# Patient Record
Sex: Male | Born: 1945 | Race: White | Hispanic: No | Marital: Married | Smoking: Former smoker
Health system: Southern US, Community
[De-identification: ages and names within clinical notes are randomized; demographics above are authoritative.]

## PROBLEM LIST (undated history)

## (undated) DIAGNOSIS — I219 Acute myocardial infarction, unspecified: Secondary | ICD-10-CM

## (undated) DIAGNOSIS — C3490 Malignant neoplasm of unspecified part of unspecified bronchus or lung: Secondary | ICD-10-CM

## (undated) DIAGNOSIS — I10 Essential (primary) hypertension: Secondary | ICD-10-CM

## (undated) DIAGNOSIS — R739 Hyperglycemia, unspecified: Secondary | ICD-10-CM

## (undated) DIAGNOSIS — I2510 Atherosclerotic heart disease of native coronary artery without angina pectoris: Secondary | ICD-10-CM

## (undated) DIAGNOSIS — E785 Hyperlipidemia, unspecified: Secondary | ICD-10-CM

## (undated) HISTORY — DX: Acute myocardial infarction, unspecified: I21.9

## (undated) HISTORY — PX: LOBECTOMY: SHX5089

## (undated) HISTORY — DX: Hyperglycemia, unspecified: R73.9

## (undated) HISTORY — DX: Hyperlipidemia, unspecified: E78.5

---

## 2000-05-27 ENCOUNTER — Ambulatory Visit (HOSPITAL_COMMUNITY): Admission: RE | Admit: 2000-05-27 | Discharge: 2000-05-27 | Payer: Self-pay | Admitting: Family Medicine

## 2000-05-27 ENCOUNTER — Encounter: Payer: Self-pay | Admitting: Family Medicine

## 2000-06-02 ENCOUNTER — Encounter: Payer: Self-pay | Admitting: Internal Medicine

## 2000-06-02 ENCOUNTER — Ambulatory Visit (HOSPITAL_COMMUNITY): Admission: RE | Admit: 2000-06-02 | Discharge: 2000-06-02 | Payer: Self-pay | Admitting: Internal Medicine

## 2000-06-11 ENCOUNTER — Encounter: Payer: Self-pay | Admitting: Thoracic Surgery

## 2000-06-12 ENCOUNTER — Inpatient Hospital Stay (HOSPITAL_COMMUNITY): Admission: RE | Admit: 2000-06-12 | Discharge: 2000-06-26 | Payer: Self-pay | Admitting: Thoracic Surgery

## 2000-06-13 ENCOUNTER — Encounter: Payer: Self-pay | Admitting: Thoracic Surgery

## 2000-06-14 ENCOUNTER — Encounter: Payer: Self-pay | Admitting: Thoracic Surgery

## 2000-06-15 ENCOUNTER — Encounter: Payer: Self-pay | Admitting: Thoracic Surgery

## 2000-06-16 ENCOUNTER — Encounter: Payer: Self-pay | Admitting: Thoracic Surgery

## 2000-06-17 ENCOUNTER — Encounter: Payer: Self-pay | Admitting: Thoracic Surgery

## 2000-06-18 ENCOUNTER — Encounter: Payer: Self-pay | Admitting: Thoracic Surgery

## 2000-06-19 ENCOUNTER — Encounter: Payer: Self-pay | Admitting: Thoracic Surgery

## 2000-06-20 ENCOUNTER — Encounter: Payer: Self-pay | Admitting: Thoracic Surgery

## 2000-06-21 ENCOUNTER — Encounter: Payer: Self-pay | Admitting: Critical Care Medicine

## 2000-06-23 ENCOUNTER — Encounter: Payer: Self-pay | Admitting: Thoracic Surgery

## 2000-06-24 ENCOUNTER — Encounter: Payer: Self-pay | Admitting: Thoracic Surgery

## 2000-06-25 ENCOUNTER — Encounter: Payer: Self-pay | Admitting: Thoracic Surgery

## 2000-06-27 ENCOUNTER — Encounter: Payer: Self-pay | Admitting: Thoracic Surgery

## 2000-07-01 ENCOUNTER — Encounter: Payer: Self-pay | Admitting: Thoracic Surgery

## 2000-07-01 ENCOUNTER — Encounter: Admission: RE | Admit: 2000-07-01 | Discharge: 2000-07-01 | Payer: Self-pay | Admitting: Thoracic Surgery

## 2000-07-17 ENCOUNTER — Encounter: Payer: Self-pay | Admitting: Thoracic Surgery

## 2000-07-17 ENCOUNTER — Encounter: Admission: RE | Admit: 2000-07-17 | Discharge: 2000-07-17 | Payer: Self-pay | Admitting: Thoracic Surgery

## 2000-08-13 ENCOUNTER — Encounter: Payer: Self-pay | Admitting: Internal Medicine

## 2000-08-13 ENCOUNTER — Ambulatory Visit (HOSPITAL_COMMUNITY): Admission: RE | Admit: 2000-08-13 | Discharge: 2000-08-13 | Payer: Self-pay | Admitting: Internal Medicine

## 2000-08-22 ENCOUNTER — Encounter: Payer: Self-pay | Admitting: Thoracic Surgery

## 2000-08-22 ENCOUNTER — Encounter: Admission: RE | Admit: 2000-08-22 | Discharge: 2000-08-22 | Payer: Self-pay | Admitting: Thoracic Surgery

## 2000-10-21 ENCOUNTER — Encounter: Admission: RE | Admit: 2000-10-21 | Discharge: 2000-10-21 | Payer: Self-pay | Admitting: Thoracic Surgery

## 2000-10-21 ENCOUNTER — Encounter: Payer: Self-pay | Admitting: Thoracic Surgery

## 2000-11-06 ENCOUNTER — Encounter (HOSPITAL_COMMUNITY): Admission: RE | Admit: 2000-11-06 | Discharge: 2001-02-04 | Payer: Self-pay | Admitting: Internal Medicine

## 2000-11-24 ENCOUNTER — Encounter: Payer: Self-pay | Admitting: Oncology

## 2000-11-24 ENCOUNTER — Encounter: Admission: RE | Admit: 2000-11-24 | Discharge: 2000-11-24 | Payer: Self-pay | Admitting: Oncology

## 2001-01-20 ENCOUNTER — Encounter: Payer: Self-pay | Admitting: Thoracic Surgery

## 2001-01-20 ENCOUNTER — Encounter: Admission: RE | Admit: 2001-01-20 | Discharge: 2001-01-20 | Payer: Self-pay | Admitting: Thoracic Surgery

## 2001-02-05 ENCOUNTER — Encounter (HOSPITAL_COMMUNITY): Admission: RE | Admit: 2001-02-05 | Discharge: 2001-05-06 | Payer: Self-pay | Admitting: Internal Medicine

## 2001-04-21 ENCOUNTER — Encounter: Payer: Self-pay | Admitting: Oncology

## 2001-04-21 ENCOUNTER — Encounter: Admission: RE | Admit: 2001-04-21 | Discharge: 2001-04-21 | Payer: Self-pay | Admitting: Oncology

## 2001-04-29 ENCOUNTER — Encounter: Admission: RE | Admit: 2001-04-29 | Discharge: 2001-04-29 | Payer: Self-pay | Admitting: Thoracic Surgery

## 2001-04-29 ENCOUNTER — Encounter: Payer: Self-pay | Admitting: Thoracic Surgery

## 2001-08-26 ENCOUNTER — Encounter: Payer: Self-pay | Admitting: Thoracic Surgery

## 2001-08-26 ENCOUNTER — Encounter: Admission: RE | Admit: 2001-08-26 | Discharge: 2001-08-26 | Payer: Self-pay | Admitting: Thoracic Surgery

## 2001-12-23 ENCOUNTER — Encounter: Admission: RE | Admit: 2001-12-23 | Discharge: 2001-12-23 | Payer: Self-pay | Admitting: Thoracic Surgery

## 2001-12-23 ENCOUNTER — Encounter: Payer: Self-pay | Admitting: Thoracic Surgery

## 2002-04-29 ENCOUNTER — Encounter: Admission: RE | Admit: 2002-04-29 | Discharge: 2002-04-29 | Payer: Self-pay | Admitting: Thoracic Surgery

## 2002-04-29 ENCOUNTER — Encounter: Payer: Self-pay | Admitting: Thoracic Surgery

## 2002-10-28 ENCOUNTER — Encounter: Payer: Self-pay | Admitting: Thoracic Surgery

## 2002-10-28 ENCOUNTER — Encounter: Admission: RE | Admit: 2002-10-28 | Discharge: 2002-10-28 | Payer: Self-pay | Admitting: Thoracic Surgery

## 2003-04-29 ENCOUNTER — Encounter: Payer: Self-pay | Admitting: Thoracic Surgery

## 2003-04-29 ENCOUNTER — Encounter: Admission: RE | Admit: 2003-04-29 | Discharge: 2003-04-29 | Payer: Self-pay | Admitting: Thoracic Surgery

## 2003-10-27 ENCOUNTER — Encounter: Admission: RE | Admit: 2003-10-27 | Discharge: 2003-10-27 | Payer: Self-pay | Admitting: Thoracic Surgery

## 2004-05-01 ENCOUNTER — Encounter: Admission: RE | Admit: 2004-05-01 | Discharge: 2004-05-01 | Payer: Self-pay | Admitting: Thoracic Surgery

## 2004-10-30 ENCOUNTER — Encounter: Admission: RE | Admit: 2004-10-30 | Discharge: 2004-10-30 | Payer: Self-pay | Admitting: Thoracic Surgery

## 2006-04-13 IMAGING — CT CT CHEST W/ CM
1 of 2 series · 16 of 30 positions shown, 20 images · IV contrast (75ML OMNI 300)
Comparison: none

CLINICAL DATA: Follow up lung cancer post right lung resection.
 CHEST CT WITH CONTRAST ? 05/01/04

[Series 4: recon 3: chest w/ · axial · 0.70mm/px · z∈[-362,-50]mm · 16 of 141 slices shown, 20 images]
[im 8/141  mediastinal]
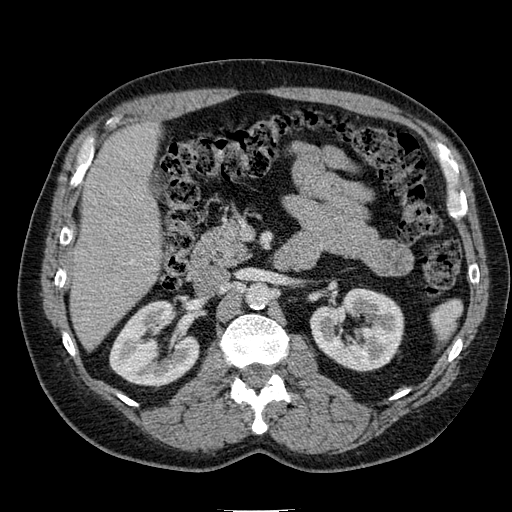
[im 8/141  lung]
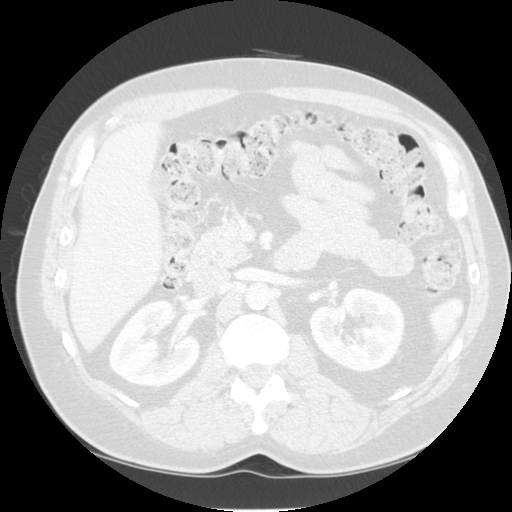
[im 16/141  lung]
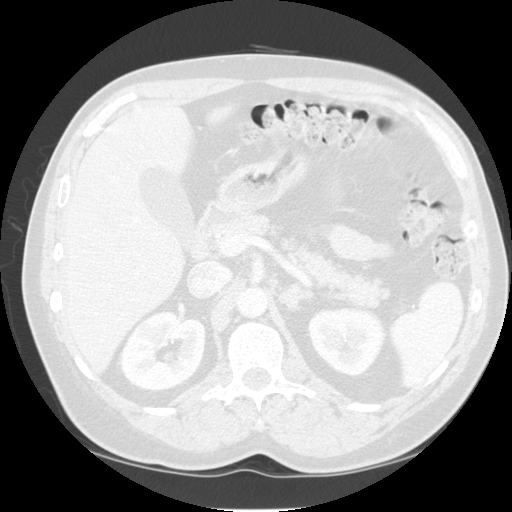
[im 24/141  lung]
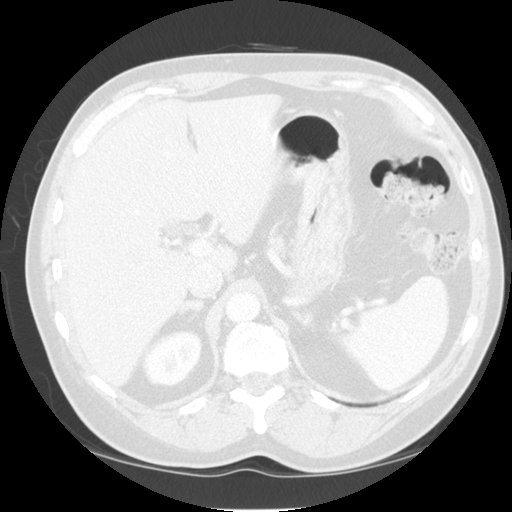
[im 32/141  lung]
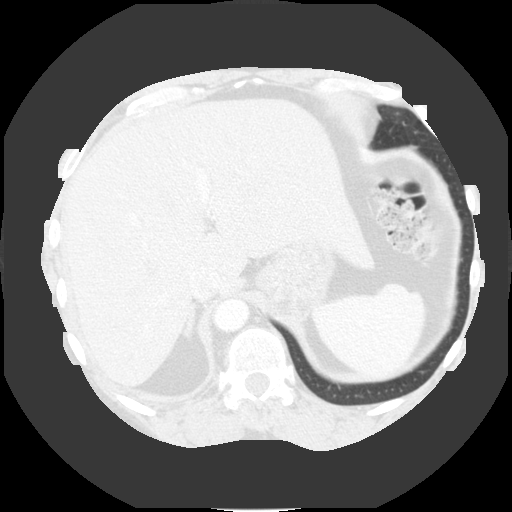
[im 47/141  mediastinal]
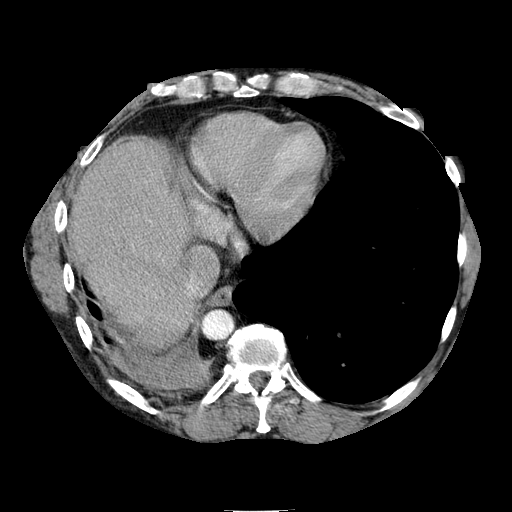
[im 47/141  lung]
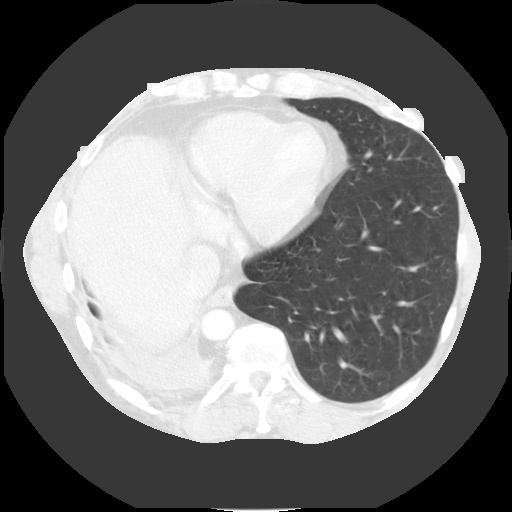
[im 55/141  lung]
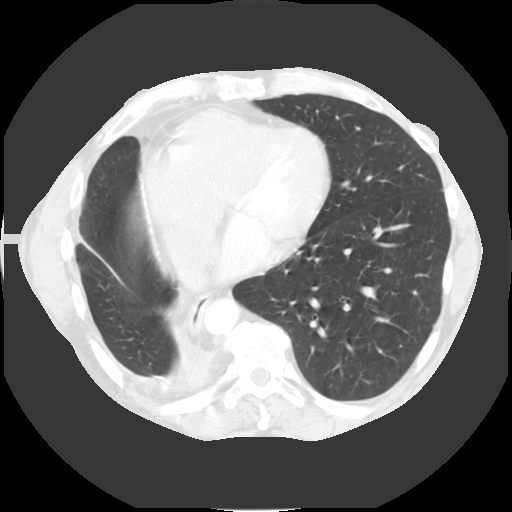
[im 63/141  lung]
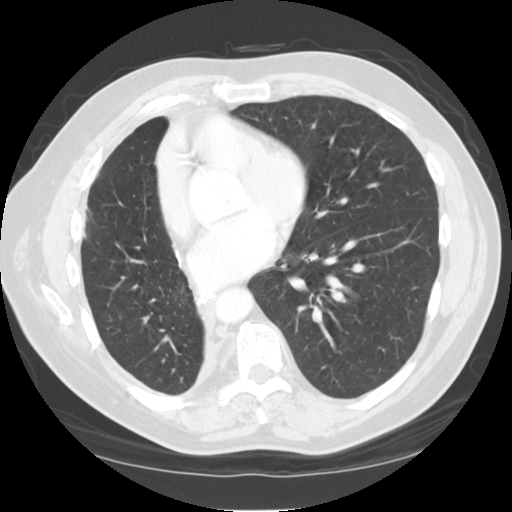
[im 67/141  lung]
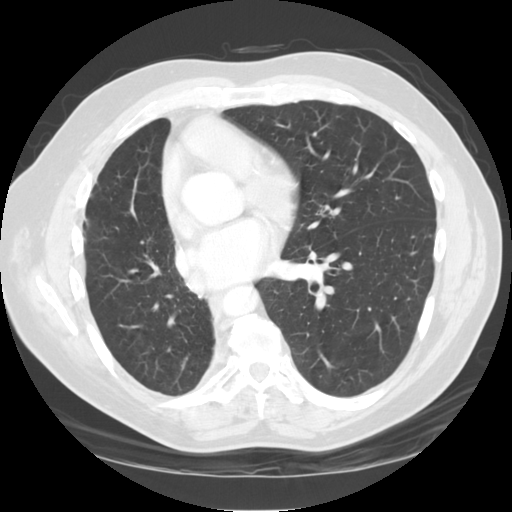
[im 71/141  mediastinal]
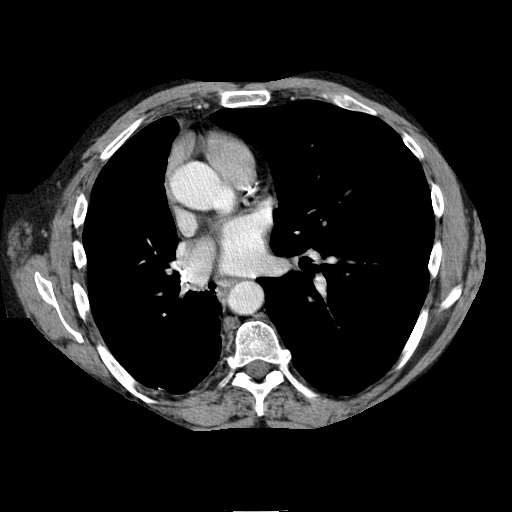
[im 71/141  lung]
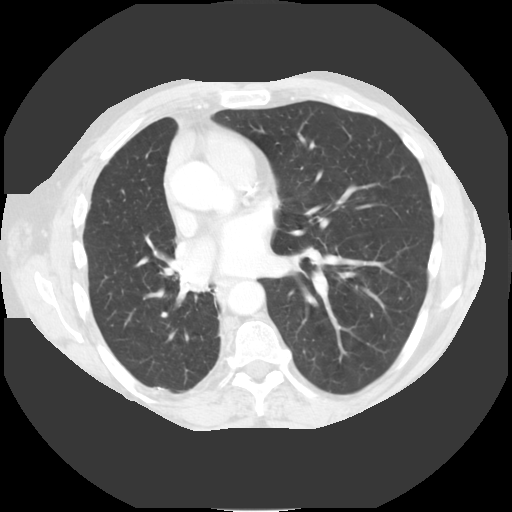
[im 78/141  lung]
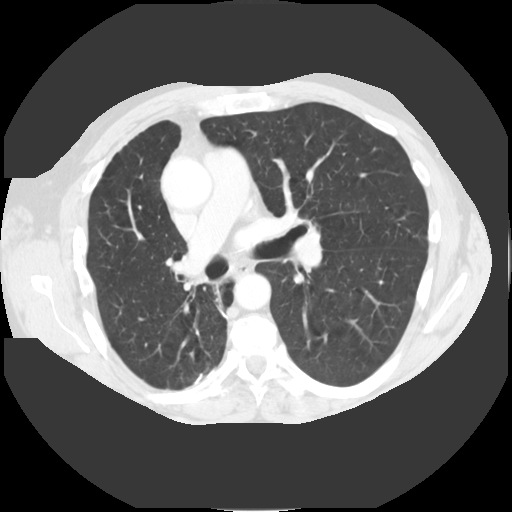
[im 86/141  lung]
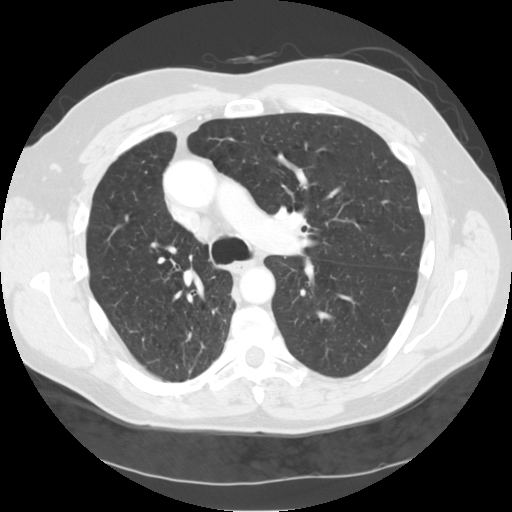
[im 94/141  lung]
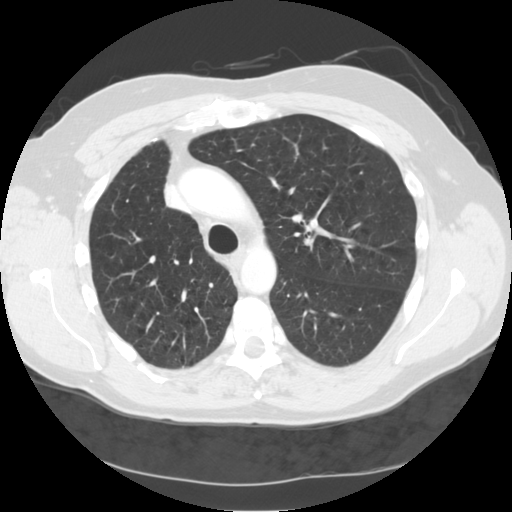
[im 109/141  mediastinal]
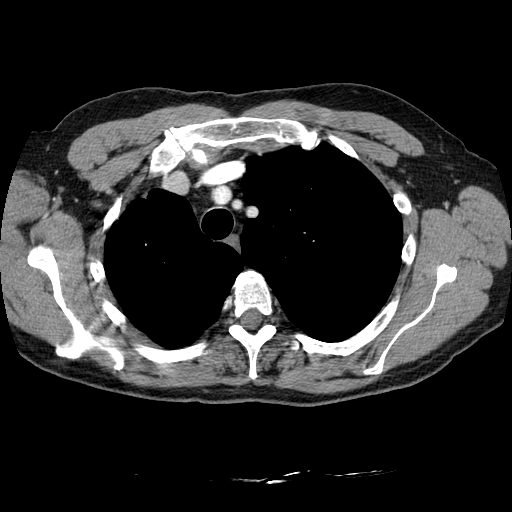
[im 109/141  lung]
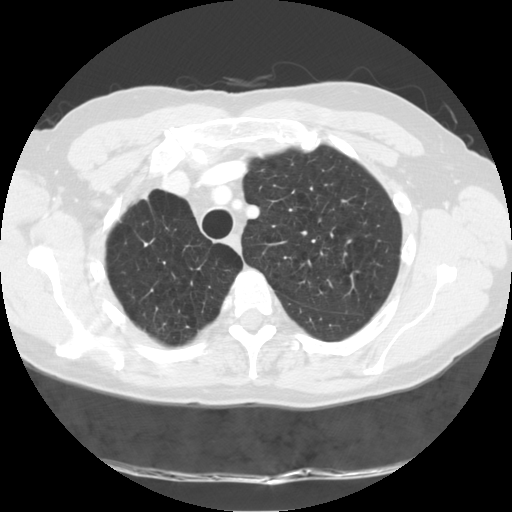
[im 117/141  lung]
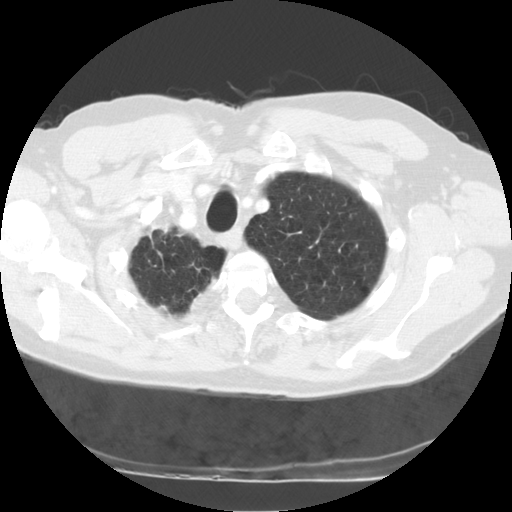
[im 125/141  lung]
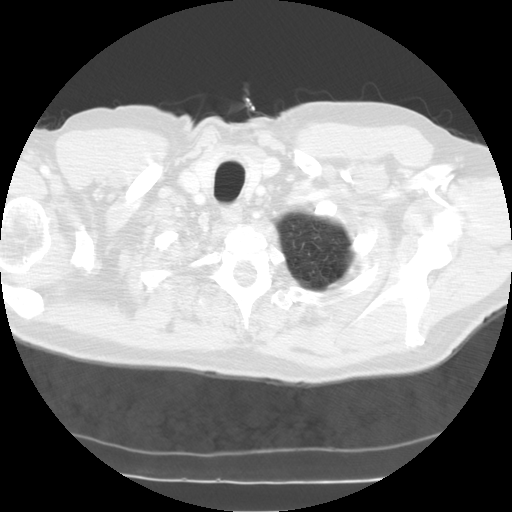
[im 133/141  lung]
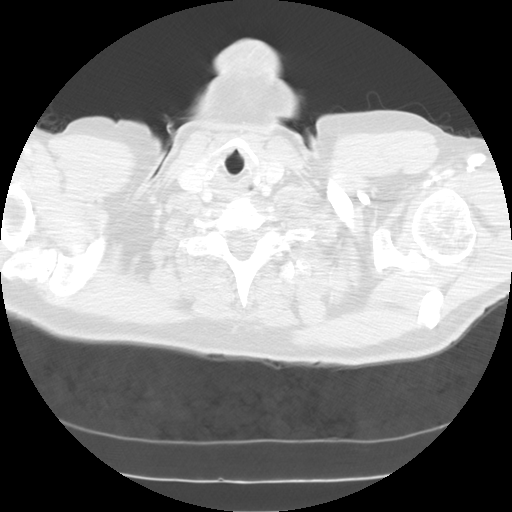

[16 of 30 positions shown; findings below may reference images not displayed]

FINDINGS: Multidetector spiral axial images were obtained through the thorax with IV injection of 75 cc Omnipaque 300 and comparison made with prior study of 04/29/03.  Since that study, there is no interval change.  Stable right lower lobectomy postoperative changes seen.  No progressive mediastinal, hilar, nor axillary mass/adenopathy   is seen.  Heart size is stable with coronary artery calcification.  Upper abdominal organs are unremarkable.  Emphysematous changes are seen with the lung otherwise clear.
 IMPRESSION
 Since 04/29/03,  no interval change:
 1.  Stable right lower lobectomy with no locally recurrent nor metastatic lung cancer.
 2.  Stable slight to moderate emphysema.  
 3.  Coronary artery calcification.

## 2010-12-02 ENCOUNTER — Encounter: Payer: Self-pay | Admitting: Thoracic Surgery

## 2014-09-03 ENCOUNTER — Encounter (HOSPITAL_COMMUNITY)
Admission: RE | Disposition: A | Discharge status: Home / Self Care | Payer: Medicare HMO | Source: Other Acute Inpatient Hospital | Attending: Cardiology

## 2014-09-03 ENCOUNTER — Inpatient Hospital Stay (HOSPITAL_COMMUNITY)
Admission: RE | Admit: 2014-09-03 | Discharge: 2014-09-05 | DRG: 247 | Disposition: A | Discharge status: Home / Self Care | Payer: Medicare HMO | Source: Other Acute Inpatient Hospital | Attending: Cardiology | Admitting: Cardiology

## 2014-09-03 ENCOUNTER — Encounter (HOSPITAL_COMMUNITY): Payer: Self-pay | Admitting: Emergency Medicine

## 2014-09-03 ENCOUNTER — Encounter (HOSPITAL_COMMUNITY)
Admission: RE | Disposition: A | Discharge status: Home / Self Care | Payer: Self-pay | Source: Other Acute Inpatient Hospital | Attending: Cardiology

## 2014-09-03 DIAGNOSIS — Z87891 Personal history of nicotine dependence: Secondary | ICD-10-CM | POA: Diagnosis not present

## 2014-09-03 DIAGNOSIS — R079 Chest pain, unspecified: Secondary | ICD-10-CM | POA: Diagnosis present

## 2014-09-03 DIAGNOSIS — E785 Hyperlipidemia, unspecified: Secondary | ICD-10-CM | POA: Diagnosis present

## 2014-09-03 DIAGNOSIS — I252 Old myocardial infarction: Secondary | ICD-10-CM | POA: Diagnosis present

## 2014-09-03 DIAGNOSIS — R739 Hyperglycemia, unspecified: Secondary | ICD-10-CM | POA: Diagnosis present

## 2014-09-03 DIAGNOSIS — Z85118 Personal history of other malignant neoplasm of bronchus and lung: Secondary | ICD-10-CM

## 2014-09-03 DIAGNOSIS — I2129 ST elevation (STEMI) myocardial infarction involving other sites: Principal | ICD-10-CM | POA: Diagnosis present

## 2014-09-03 DIAGNOSIS — I2510 Atherosclerotic heart disease of native coronary artery without angina pectoris: Secondary | ICD-10-CM | POA: Diagnosis present

## 2014-09-03 DIAGNOSIS — I10 Essential (primary) hypertension: Secondary | ICD-10-CM

## 2014-09-03 HISTORY — DX: Essential (primary) hypertension: I10

## 2014-09-03 HISTORY — DX: Atherosclerotic heart disease of native coronary artery without angina pectoris: I25.10

## 2014-09-03 HISTORY — DX: Malignant neoplasm of unspecified part of unspecified bronchus or lung: C34.90

## 2014-09-03 HISTORY — PX: LEFT HEART CATH: SHX5478

## 2014-09-03 LAB — HEMOGLOBIN A1C
Hgb A1c MFr Bld: 5.6 % (ref <5.7)
Hgb A1c MFr Bld: 5.9 % — ABNORMAL HIGH (ref <5.7)
Mean Plasma Glucose: 114 mg/dL (ref <117)
Mean Plasma Glucose: 123 mg/dL — ABNORMAL HIGH (ref <117)

## 2014-09-03 LAB — LIPID PANEL
Cholesterol: 233 mg/dL — ABNORMAL HIGH (ref 0–200)
HDL: 33 mg/dL — ABNORMAL LOW (ref >39)
LDL Cholesterol: 181 mg/dL — ABNORMAL HIGH (ref 0–99)
Total CHOL/HDL Ratio: 7.1 RATIO
Triglycerides: 95 mg/dL (ref <150)
VLDL: 19 mg/dL (ref 0–40)

## 2014-09-03 LAB — CBC WITH DIFFERENTIAL/PLATELET
Basophils Absolute: 0 10*3/uL (ref 0.0–0.1)
Basophils Relative: 0 % (ref 0–1)
Eosinophils Absolute: 0 10*3/uL (ref 0.0–0.7)
Eosinophils Relative: 0 % (ref 0–5)
HCT: 42.7 % (ref 39.0–52.0)
Hemoglobin: 14.7 g/dL (ref 13.0–17.0)
Lymphocytes Relative: 8 % — ABNORMAL LOW (ref 12–46)
Lymphs Abs: 0.9 10*3/uL (ref 0.7–4.0)
MCH: 30.8 pg (ref 26.0–34.0)
MCHC: 34.4 g/dL (ref 30.0–36.0)
MCV: 89.5 fL (ref 78.0–100.0)
Monocytes Absolute: 0.5 10*3/uL (ref 0.1–1.0)
Monocytes Relative: 5 % (ref 3–12)
Neutro Abs: 9.7 10*3/uL — ABNORMAL HIGH (ref 1.7–7.7)
Neutrophils Relative %: 87 % — ABNORMAL HIGH (ref 43–77)
Platelets: 318 10*3/uL (ref 150–400)
RBC: 4.77 MIL/uL (ref 4.22–5.81)
RDW: 13 % (ref 11.5–15.5)
WBC: 11.2 10*3/uL — ABNORMAL HIGH (ref 4.0–10.5)

## 2014-09-03 LAB — CK TOTAL AND CKMB (NOT AT ARMC)
CK, MB: 24 ng/mL (ref 0.3–4.0)
Relative Index: 10 — ABNORMAL HIGH (ref 0.0–2.5)
Total CK: 241 U/L — ABNORMAL HIGH (ref 7–232)

## 2014-09-03 LAB — PROTIME-INR
INR: 5.73 (ref 0.00–1.49)
Prothrombin Time: 52 seconds — ABNORMAL HIGH (ref 11.6–15.2)

## 2014-09-03 LAB — MAGNESIUM: Magnesium: 2 mg/dL (ref 1.5–2.5)

## 2014-09-03 LAB — BASIC METABOLIC PANEL
Anion gap: 12 (ref 5–15)
BUN: 12 mg/dL (ref 6–23)
CO2: 21 mEq/L (ref 19–32)
Calcium: 8.1 mg/dL — ABNORMAL LOW (ref 8.4–10.5)
Chloride: 99 mEq/L (ref 96–112)
Creatinine, Ser: 0.97 mg/dL (ref 0.50–1.35)
GFR calc Af Amer: >90 mL/min (ref >90)
GFR calc non Af Amer: 83 mL/min — ABNORMAL LOW (ref >90)
Glucose, Bld: 121 mg/dL — ABNORMAL HIGH (ref 70–99)
Potassium: 3.9 mEq/L (ref 3.7–5.3)
Sodium: 132 mEq/L — ABNORMAL LOW (ref 137–147)

## 2014-09-03 LAB — COMPREHENSIVE METABOLIC PANEL
ALT: 21 U/L (ref 0–53)
AST: 80 U/L — ABNORMAL HIGH (ref 0–37)
Albumin: 3.6 g/dL (ref 3.5–5.2)
Alkaline Phosphatase: 78 U/L (ref 39–117)
Anion gap: 12 (ref 5–15)
BUN: 11 mg/dL (ref 6–23)
CO2: 23 mEq/L (ref 19–32)
Calcium: 8.8 mg/dL (ref 8.4–10.5)
Chloride: 97 mEq/L (ref 96–112)
Creatinine, Ser: 0.84 mg/dL (ref 0.50–1.35)
GFR calc Af Amer: >90 mL/min (ref >90)
GFR calc non Af Amer: 88 mL/min — ABNORMAL LOW (ref >90)
Glucose, Bld: 130 mg/dL — ABNORMAL HIGH (ref 70–99)
Potassium: 4.3 mEq/L (ref 3.7–5.3)
Sodium: 132 mEq/L — ABNORMAL LOW (ref 137–147)
Total Bilirubin: 0.3 mg/dL (ref 0.3–1.2)
Total Protein: 7.5 g/dL (ref 6.0–8.3)

## 2014-09-03 LAB — CBC
HCT: 36.1 % — ABNORMAL LOW (ref 39.0–52.0)
Hemoglobin: 12.6 g/dL — ABNORMAL LOW (ref 13.0–17.0)
MCH: 30.7 pg (ref 26.0–34.0)
MCHC: 34.9 g/dL (ref 30.0–36.0)
MCV: 88 fL (ref 78.0–100.0)
Platelets: 313 10*3/uL (ref 150–400)
RBC: 4.1 MIL/uL — ABNORMAL LOW (ref 4.22–5.81)
RDW: 12.9 % (ref 11.5–15.5)
WBC: 9.3 10*3/uL (ref 4.0–10.5)

## 2014-09-03 LAB — MRSA PCR SCREENING: MRSA by PCR: NEGATIVE

## 2014-09-03 LAB — TSH: TSH: 6.31 u[IU]/mL — ABNORMAL HIGH (ref 0.350–4.500)

## 2014-09-03 LAB — TROPONIN I
Troponin I: 1.34 ng/mL (ref <0.30)
Troponin I: 11.27 ng/mL (ref <0.30)
Troponin I: >20 ng/mL (ref <0.30)

## 2014-09-03 LAB — APTT: aPTT: >200 seconds (ref 24–37)

## 2014-09-03 SURGERY — LEFT HEART CATH
Anesthesia: LOCAL | Laterality: Bilateral

## 2014-09-03 MED ORDER — DIAZEPAM 2 MG PO TABS: 2.0000 mg | ORAL_TABLET | ORAL | Status: DC | PRN

## 2014-09-03 MED ORDER — LIDOCAINE HCL (PF) 1 % IJ SOLN
INTRAMUSCULAR | Status: AC
  Filled 2014-09-03: qty 30

## 2014-09-03 MED ORDER — HEPARIN SODIUM (PORCINE) 5000 UNIT/ML IJ SOLN
5000.0000 [IU] | Freq: Once | INTRAMUSCULAR | Status: AC
  Administered 2014-09-03: 5000 [IU] via INTRAVENOUS

## 2014-09-03 MED ORDER — PRASUGREL HCL 10 MG PO TABS
ORAL_TABLET | ORAL | Status: AC
  Filled 2014-09-03: qty 1

## 2014-09-03 MED ORDER — NITROGLYCERIN 0.4 MG SL SUBL: 0.4000 mg | SUBLINGUAL_TABLET | SUBLINGUAL | Status: DC | PRN

## 2014-09-03 MED ORDER — INFLUENZA VAC SPLIT QUAD 0.5 ML IM SUSY
0.5000 mL | PREFILLED_SYRINGE | INTRAMUSCULAR | Status: AC
  Administered 2014-09-04: 0.5 mL via INTRAMUSCULAR
  Filled 2014-09-03: qty 0.5

## 2014-09-03 MED ORDER — ATORVASTATIN CALCIUM 80 MG PO TABS
80.0000 mg | ORAL_TABLET | Freq: Every day | ORAL | Status: DC
  Administered 2014-09-03 – 2014-09-04 (×2): 80 mg via ORAL
  Filled 2014-09-03 (×3): qty 1

## 2014-09-03 MED ORDER — ASPIRIN EC 81 MG PO TBEC
81.0000 mg | DELAYED_RELEASE_TABLET | Freq: Every day | ORAL | Status: DC
  Administered 2014-09-04 – 2014-09-05 (×2): 81 mg via ORAL
  Filled 2014-09-03 (×2): qty 1

## 2014-09-03 MED ORDER — VERAPAMIL HCL 2.5 MG/ML IV SOLN
INTRAVENOUS | Status: AC
  Filled 2014-09-03: qty 2

## 2014-09-03 MED ORDER — ZOLPIDEM TARTRATE 5 MG PO TABS: 5.0000 mg | ORAL_TABLET | Freq: Every evening | ORAL | Status: DC | PRN

## 2014-09-03 MED ORDER — LISINOPRIL 20 MG PO TABS
20.0000 mg | ORAL_TABLET | Freq: Every day | ORAL | Status: DC
  Administered 2014-09-03 – 2014-09-04 (×2): 20 mg via ORAL
  Filled 2014-09-03 (×2): qty 1

## 2014-09-03 MED ORDER — ACETAMINOPHEN 325 MG PO TABS: 650.0000 mg | ORAL_TABLET | ORAL | Status: DC | PRN

## 2014-09-03 MED ORDER — NITROGLYCERIN 1 MG/10 ML FOR IR/CATH LAB
INTRA_ARTERIAL | Status: AC
  Filled 2014-09-03: qty 10

## 2014-09-03 MED ORDER — ONDANSETRON HCL 4 MG/2ML IJ SOLN: 4.0000 mg | Freq: Four times a day (QID) | INTRAMUSCULAR | Status: DC | PRN

## 2014-09-03 MED ORDER — CARVEDILOL 3.125 MG PO TABS
3.1250 mg | ORAL_TABLET | Freq: Two times a day (BID) | ORAL | Status: DC
Start: 2014-09-03 — End: 2014-09-03
  Filled 2014-09-03 (×2): qty 1

## 2014-09-03 MED ORDER — BIVALIRUDIN 250 MG IV SOLR
INTRAVENOUS | Status: AC
  Filled 2014-09-03: qty 250

## 2014-09-03 MED ORDER — HEPARIN SODIUM (PORCINE) 1000 UNIT/ML IJ SOLN
INTRAMUSCULAR | Status: AC
  Filled 2014-09-03: qty 1

## 2014-09-03 MED ORDER — HEPARIN (PORCINE) IN NACL 2-0.9 UNIT/ML-% IJ SOLN
INTRAMUSCULAR | Status: AC
  Filled 2014-09-03: qty 1000

## 2014-09-03 MED ORDER — PRASUGREL HCL 10 MG PO TABS
10.0000 mg | ORAL_TABLET | Freq: Every day | ORAL | Status: DC
  Administered 2014-09-04 – 2014-09-05 (×2): 10 mg via ORAL
  Filled 2014-09-03 (×2): qty 1

## 2014-09-03 MED ORDER — SODIUM CHLORIDE 0.9 % IV SOLN
1.7500 mg/kg/h | INTRAVENOUS | Status: DC
  Filled 2014-09-03: qty 250

## 2014-09-03 MED ORDER — SODIUM CHLORIDE 0.9 % IV SOLN: 1.0000 mL/kg/h | INTRAVENOUS | Status: AC

## 2014-09-03 MED ORDER — PRASUGREL HCL 10 MG PO TABS
10.0000 mg | ORAL_TABLET | Freq: Every day | ORAL | Status: DC
  Filled 2014-09-03 (×2): qty 1

## 2014-09-03 MED ORDER — CARVEDILOL 6.25 MG PO TABS
6.2500 mg | ORAL_TABLET | Freq: Two times a day (BID) | ORAL | Status: DC
  Administered 2014-09-03 – 2014-09-05 (×4): 6.25 mg via ORAL
  Filled 2014-09-03 (×6): qty 1

## 2014-09-03 NOTE — ED Notes (Signed)
Pt. Transported to cath lab; zoll pads in place; transport uneventful; gave report to cath lab team.

## 2014-09-03 NOTE — ED Provider Notes (Signed)
CSN: [id number]     Arrival date & time 09/03/14  1345 History   None    Chief Complaint  Patient presents with  . Code STEMI     (Consider location/radiation/quality/duration/timing/severity/associated sxs/prior Treatment) HPI Comments: Patient here complaining of sudden onset of central chest pain which began while doing light activity. He had associated dyspnea and diaphoresis. Pain did not radiate to his back or down his arms or to his neck. Symptoms were intermittent and lasting for minutes. No exertional component to it. No prior history of same. Called EMS and was found to have ST elevation in leads 1 and aVL. Code STEMI activated prior to arrival. He was given aspirin and nitroglycerin with relief of his symptoms. Current chest pain is 4 out of 10.  The history is provided by the patient.    No past medical history on file. No past surgical history on file. No family history on file. History  Substance Use Topics  . Smoking status: Not on file  . Smokeless tobacco: Not on file  . Alcohol Use: Not on file    Review of Systems  All other systems reviewed and are negative.     Allergies  Review of patient's allergies indicates not on file.  Home Medications   Prior to Admission medications   Not on File   There were no vitals taken for this visit. Physical Exam  Nursing note and vitals reviewed. Constitutional: He is oriented to person, place, and time. He appears well-developed and well-nourished.  Non-toxic appearance. No distress.  HENT:  Head: Normocephalic and atraumatic.  Eyes: Conjunctivae, EOM and lids are normal. Pupils are equal, round, and reactive to light.  Neck: Normal range of motion. Neck supple. No tracheal deviation present. No mass present.  Cardiovascular: Normal rate, regular rhythm and normal heart sounds.  Exam reveals no gallop.   No murmur heard. Pulmonary/Chest: Effort normal and breath sounds normal. No stridor. No respiratory  distress. He has no decreased breath sounds. He has no wheezes. He has no rhonchi. He has no rales.  Abdominal: Soft. Normal appearance and bowel sounds are normal. He exhibits no distension. There is no tenderness. There is no rebound and no CVA tenderness.  Musculoskeletal: Normal range of motion. He exhibits no edema and no tenderness.  Neurological: He is alert and oriented to person, place, and time. He has normal strength. No cranial nerve deficit or sensory deficit. GCS eye subscore is 4. GCS verbal subscore is 5. GCS motor subscore is 6.  Skin: Skin is warm and dry. No abrasion and no rash noted.  Psychiatric: He has a normal mood and affect. His speech is normal and behavior is normal.    ED Course  Procedures (including critical care time) Labs Review Labs Reviewed - No data to display  Imaging Review No results found.   EKG Interpretation   Date/Time:  Saturday September 03 2014 13:49:55 EDT Ventricular Rate:  83 PR Interval:  162 QRS Duration: 91 QT Interval:  363 QTC Calculation: 426 R Axis:   22 Text Interpretation:  Sinus rhythm Probable left atrial enlargement  Lateral infarct, acute (LAD) Borderline ST elevation, anterior leads  Baseline wander in lead(s) V6 posterior lateral infarct, acute Confirmed  by Prestin Munch  MD, Leyla Soliz ([id number]) on 09/03/2014 1:53:45 PM      MDM   Final diagnoses:  None   Cardiologist at bedside and patient to go to the heart catheterization lab     Leota Jacobsen, MD  09/03/14 1355 

## 2014-09-03 NOTE — ED Notes (Signed)
[id number]: chest tightness, non radiating across chest; 1230: increasing chest tightness - more tight than in am, non radiating, no diaphoresis, no sob; EMS gave zofran 4mg , morophine 4mg , x 3 ntg sl 0.4 mg, and 324 mg asa; current pain level 5/10

## 2014-09-03 NOTE — H&P (Signed)
Antonio Lawrence is an 68 y.o. male.   Chief Complaint: Chest pain HPI: Patient is a 68 year-old Caucasian male with history of squamous cell lung carcinoma, status post right lower lobe lobectomy sometime in 1991, history of smoking which he quit after the diagnosis of lung cancer, otherwise doing well and not on any medications, started experiencing chest discomfort at 8 AM this morning on and off without any other associated symptoms. At around 12 p.m. the pain  got intense, eventually around 1 to 1:30 or so he called EMS and was found to have ST elevation in 1 and aVL with ST depression in V1 and V2 suggestive of high lateral and posterior acute ST elevation myocardial infarction. Hence he was then taken emergently to the coronary angiography suite.   Past Medical History  Diagnosis Date  . Lung cancer     Past Surgical History  Procedure Laterality Date  . Lobectomy      History reviewed. No pertinent family history. Social History:  reports that he has quit smoking. He does not have any smokeless tobacco history on file. He reports that he drinks alcohol. He reports that he does not use illicit drugs.  Allergies: No Known Allergies  No prescriptions prior to admission    Review of Systems - Negative except Chest pain. Patient denied any GI disturbances, no black stools or tarry stools, denies dyspnea, no history to suggest PND or orthopnea, no recent weight changes. Not a diabetic. Other systems negative. No neurologic deficits or claudication.  Pulse 53, height $RemoveBe'5\' 8"'QnRFytmaZ$  (1.727 m), weight 81 kg (178 lb 9.2 oz), SpO2 0.00%. General appearance: alert, cooperative, appears stated age and no distress Eyes: negative findings: lids and lashes normal and conjunctivae and sclerae normal Neck: no adenopathy, no carotid bruit, no JVD, supple, symmetrical, trachea midline and thyroid not enlarged, symmetric, no tenderness/mass/nodules Neck: JVP - normal, carotids 2+= without bruits Resp: clear  to auscultation bilaterally Chest wall: no tenderness Cardio: regular rate and rhythm, S1, S2 normal, no murmur, click, rub or gallop GI: soft, non-tender; bowel sounds normal; no masses,  no organomegaly Extremities: extremities normal, atraumatic, no cyanosis or edema Pulses: 2+ and symmetric Skin: Skin color, texture, turgor normal. No rashes or lesions Neurologic: Grossly normal  Results for orders placed during the hospital encounter of 09/03/14 (from the past 48 hour(s))  BASIC METABOLIC PANEL     Status: Abnormal   Collection Time    09/03/14  3:10 PM      Result Value Ref Range   Sodium 132 (*) 137 - 147 mEq/L   Potassium 3.9  3.7 - 5.3 mEq/L   Chloride 99  96 - 112 mEq/L   CO2 21  19 - 32 mEq/L   Glucose, Bld 121 (*) 70 - 99 mg/dL   BUN 12  6 - 23 mg/dL   Creatinine, Ser 0.97  0.50 - 1.35 mg/dL   Calcium 8.1 (*) 8.4 - 10.5 mg/dL   GFR calc non Af Amer 83 (*) >90 mL/min   GFR calc Af Amer >90  >90 mL/min   Comment: (NOTE)     The eGFR has been calculated using the CKD EPI equation.     This calculation has not been validated in all clinical situations.     eGFR's persistently <90 mL/min signify possible Chronic Kidney     Disease.   Anion gap 12  5 - 15  CBC     Status: Abnormal   Collection Time  09/03/14  3:10 PM      Result Value Ref Range   WBC 9.3  4.0 - 10.5 K/uL   RBC 4.10 (*) 4.22 - 5.81 MIL/uL   Hemoglobin 12.6 (*) 13.0 - 17.0 g/dL   HCT 36.1 (*) 39.0 - 52.0 %   MCV 88.0  78.0 - 100.0 fL   MCH 30.7  26.0 - 34.0 pg   MCHC 34.9  30.0 - 36.0 g/dL   RDW 12.9  11.5 - 15.5 %   Platelets 313  150 - 400 K/uL  LIPID PANEL     Status: Abnormal   Collection Time    09/03/14  3:10 PM      Result Value Ref Range   Cholesterol 233 (*) 0 - 200 mg/dL   Triglycerides 95  <150 mg/dL   HDL 33 (*) >39 mg/dL   Total CHOL/HDL Ratio 7.1     VLDL 19  0 - 40 mg/dL   LDL Cholesterol 181 (*) 0 - 99 mg/dL   Comment:            Total Cholesterol/HDL:CHD Risk     Coronary  Heart Disease Risk Table                         Men   Women      1/2 Average Risk   3.4   3.3      Average Risk       5.0   4.4      2 X Average Risk   9.6   7.1      3 X Average Risk  23.4   11.0                Use the calculated Patient Ratio     above and the CHD Risk Table     to determine the patient's CHD Risk.                ATP III CLASSIFICATION (LDL):      <100     mg/dL   Optimal      100-129  mg/dL   Near or Above                        Optimal      130-159  mg/dL   Borderline      160-189  mg/dL   High      >190     mg/dL   Very High   No results found.  Labs:   Lab Results  Component Value Date   WBC 9.3 09/03/2014   HGB 12.6* 09/03/2014   HCT 36.1* 09/03/2014   MCV 88.0 09/03/2014   PLT 313 09/03/2014    Recent Labs Lab 09/03/14 1510  NA 132*  K 3.9  CL 99  CO2 21  BUN 12  CREATININE 0.97  CALCIUM 8.1*  GLUCOSE 121*   No results found for this basename: CKTOTAL, CKMB, CKMBINDEX, TROPONINI    Lipid Panel     Component Value Date/Time   CHOL 233* 09/03/2014 1510   TRIG 95 09/03/2014 1510   HDL 33* 09/03/2014 1510   CHOLHDL 7.1 09/03/2014 1510   VLDL 19 09/03/2014 1510   LDLCALC 181* 09/03/2014 1510    EKG: ST elevation in lead 1 and aVL, ST depression in V5 V6. Compared to EMS EKG, ST depression in V1 and V2 not as evident.   Assessment/Plan Acute  inferior and possibly posterior myocardial infarction, suspect circumflex coronary artery distribution. History of lung cancer, squamous cell status post right lower lobe lobectomy in the remote past. Hyperlipidemia  Recommendation: Patient will be taken to the coronary angiography suite emergently and further recommendations will follow.  Laverda Page, MD 09/03/2014, 3:52 PM [facility] [place] Pager: [phone] Office: [phone] If no answer: Cell:  [phone]

## 2014-09-03 NOTE — CV Procedure (Addendum)
Procedure performed: Emergent left heart catheterization and angioplasty for acute high lateral and posterior myocardial infarction.  Left heart catheterization including hemodynamic monitoring of the left ventricle, LV gram. Selective right and left coronary arteriography. PTCA and stenting of the OM1 branch of circumflex coronary artery with implantation of a 2.5 x 18 mm Xience Alpine DES. Difficult procedure due to access anatomy, coronary anatomy extremely difficult and complex.  Indication: Patient is a 68 year-old Caucasian male with history of squamous cell lung carcinoma, status post right lower lobe lobectomy sometime in 1991, history of smoking which he quit after the diagnosis of lung cancer, otherwise doing well and not on any medications, started experiencing chest discomfort at 8 AM this morning on and off without any other associated symptoms. At around 12 noon got intense, eventually around 1 to 1:30 or so he called EMS and was found to have ST elevation in 1 and aVL with ST depression in V1 and V2 suggestive of high lateral and posterior acute ST elevation myocardial infarction. Hence was brought emergently to the coronary angiography suite to evaluate his coronary anatomy.   Hemodynamic data: Left ventricular pressure was 114/11 with LVEDP of 25 mm mercury. Aortic pressure was 117/66 with a mean of 86 mm mercury. There was no pressure gradient across the aortic valve.   Left ventricle: Performed in the RAO/LAO projection revealed LVEF of 50-55% %. There was no significant MR. inferior lateral wall moderate hypokinesis.  Right coronary artery:  Dominant. moderately calcified throughout, occluded in the midsegment after the origin of RV branch. The occlusion has the appearance of acute occlusion, there are 2 contralateral collaterals from the left system.   Left main coronary artery: Calcified. Has an unrelated origin. Eccentric 20-30% stenosis.   Circumflex coronary artery: A large  vessel giving origin to a large obtuse marginal 1. there is a moderate size OM 2 and a moderate size OM 3. There is moderate diffuse disease and proximal coronary calcification. The OM1 is a very large vessel and has ulceration in the proximal segment and is a high-grade 99% stenosis with TIMI 1-2 flow.   LAD:  LAD gives origin to a large diagonal-1.  LAD has Diffuse mild to moderate luminal irregularities. 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 Distal LAD has a 70% to 80% stenosis towards the apex. Midsegment has a 40% stenosis. There are collaterals to right coronary artery.   Impression:  extremely difficult patient anatomy to access the coronaries due to severe calcification and also tortuosity of the subclavian artery. The circumflex coronary artery is the culprit, however I cannot exclude right coronary artery also to be a culprit although there are type II collaterals to the distal right coronary artery. I will proceed with intervention to the OM1 and then attempt antiplastic to the right coronary artery.  Interventional data: Successful PTCA and stenting of the Proximal OM12.5 x 18 mm with Xience Alpine DES.  Will need Dual antiplatelet therapy with Effient and ASA 81 mg for at least one year or more.   Technique of diagnostic cardiac catheterization:  Under sterile precautions using a 6 French right radial  arterial access, a 6 French sheath was introduced into the right radial artery. I was unable to access the ascending aorta due to severe tortuosity of the subclavian artery. This led to significant delay overall due to his anatomy and with the help of a 0.035 inch Glidewire I was able to access the ascending aorta.  A 5 FrencJR4 diagnostic  catheter was advanced into the ascending aorta selective  right coronary artery angiography was performed. I then utilized a Judkins left 4 diagnostic catheter to engage  left coronary artery was cannulated and angiography was performed in  multiple views. The catheter was pulled back Out of the body over exchange length J-wire.  Technique of intervention:  Using a 6 FXB 3.5 guide catheter the Left mainonary  was selected and cannulated. Using Angiomax for anticoagulation, I utilized a  Asahi pro-water 0.014 x 190 cm uidewire and across the  OM1coronary artery with   significant amount of difficulty and reshaping the guidewire due to acute angulation of the OM1.  I placed the tip of the wire into the distal  coronary artery. Angiography was performed. Then I utilized a  2.5 x 12 mm Emerge balloon , I performed balloon angioplasty at 10 atmospheric  pressure x  2  For 30 seconds each.   I proceeded with implantation 2.5x18 mXience Alpineug-eluting stent intOM1  coronary artery. The stent was deployed at 12 atmospheric pressure 50  seconds. Post angioplasty results were excellent with 0% residual stenoses and TIMI-3 flow was maintained. There was no evidence of edge dissection. The guidewire was withdrawn out of the body and the guide catheter was engaged and pulled out of the body over the J-wire the was no immediate complication. Patient tolerated the procedure well.  I then exchanged the XB 3.5 guide catheter to 6 [place]  [place] 1.0 guide catheter to engage the right coronary artery. I then utilized a BMW 0.014" x 1 80 cm guidewire with the help of a previously utilized 2.5 mm balloon as a backup support I attempted to cross the occlusion. The vessel behaved like a chronic total occlusion, hence I abandoned the procedure and proceeded to perform LV gram.  Catheter exchange was done over the J-wire, a 5 French pigtail catheter was advanced into the ascending aorta and then into the left ventricle, LV gram was performed both in the liver are not projection and then the catheter was pulled out of the body was J-wire. Patient also procedure well. Hemostasis was obtained by applying TR band. It was felt that the right coronary artery was a CTO and  can be reevaluated at later date.  The left main coronary artery in some views appear to be critical, however your with guide catheter engagement there was no hemodynamic consequence and hence I did not feel that the left main stenosis was significant. Also the fact that patient remained hemodynamically stable throughout the procedure confirmed the nonsignificance of left main disease.

## 2014-09-03 NOTE — ED Notes (Signed)
Code Stemi MD at bedside.

## 2014-09-03 NOTE — Progress Notes (Signed)
CRITICAL VALUE ALERT  Critical value received: ckmb/trop/ptt/inr  Date of notification:  09/03/2014  Time of notification:  8127  Critical value read back:Yes.    Nurse who received alert:  abarlow  MD notified (1st page):  ganji  Time of first page:  1600  MD notified (2nd page):  Time of second page:  Responding MD:  ganji  Time MD responded:  1600

## 2014-09-03 NOTE — ED Notes (Signed)
Transported to cath lab

## 2014-09-04 DIAGNOSIS — I2129 ST elevation (STEMI) myocardial infarction involving other sites: Secondary | ICD-10-CM | POA: Diagnosis not present

## 2014-09-04 LAB — CBC
HCT: 40.2 % (ref 39.0–52.0)
Hemoglobin: 13.5 g/dL (ref 13.0–17.0)
MCH: 30.3 pg (ref 26.0–34.0)
MCHC: 33.6 g/dL (ref 30.0–36.0)
MCV: 90.3 fL (ref 78.0–100.0)
Platelets: 302 10*3/uL (ref 150–400)
RBC: 4.45 MIL/uL (ref 4.22–5.81)
RDW: 13.2 % (ref 11.5–15.5)
WBC: 9.2 10*3/uL (ref 4.0–10.5)

## 2014-09-04 LAB — BASIC METABOLIC PANEL
Anion gap: 16 — ABNORMAL HIGH (ref 5–15)
BUN: 10 mg/dL (ref 6–23)
CO2: 22 mEq/L (ref 19–32)
Calcium: 8.5 mg/dL (ref 8.4–10.5)
Chloride: 98 mEq/L (ref 96–112)
Creatinine, Ser: 0.88 mg/dL (ref 0.50–1.35)
GFR calc Af Amer: >90 mL/min (ref >90)
GFR calc non Af Amer: 86 mL/min — ABNORMAL LOW (ref >90)
Glucose, Bld: 149 mg/dL — ABNORMAL HIGH (ref 70–99)
Potassium: 3.5 mEq/L — ABNORMAL LOW (ref 3.7–5.3)
Sodium: 136 mEq/L — ABNORMAL LOW (ref 137–147)

## 2014-09-04 LAB — TROPONIN I: Troponin I: >20 ng/mL (ref <0.30)

## 2014-09-04 LAB — TSH: TSH: 2.89 u[IU]/mL (ref 0.350–4.500)

## 2014-09-04 LAB — T4, FREE: Free T4: 1.08 ng/dL (ref 0.80–1.80)

## 2014-09-04 LAB — T3, FREE: T3, Free: 3 pg/mL (ref 2.3–4.2)

## 2014-09-04 MED ORDER — LISINOPRIL 10 MG PO TABS
10.0000 mg | ORAL_TABLET | Freq: Every day | ORAL | Status: DC
  Administered 2014-09-05: 10 mg via ORAL
  Filled 2014-09-04: qty 1

## 2014-09-04 MED ORDER — POTASSIUM CHLORIDE CRYS ER 20 MEQ PO TBCR
20.0000 meq | EXTENDED_RELEASE_TABLET | Freq: Two times a day (BID) | ORAL | Status: AC
  Administered 2014-09-04 (×2): 20 meq via ORAL
  Filled 2014-09-04 (×2): qty 1

## 2014-09-04 NOTE — Progress Notes (Signed)
Order for transfer to Telemetry acknowledged. Pt informed and verbalized understanding. Report called to 3W RN. Belongings packed and pt transported via w/c accompanied by wife and Therapist, sports. R radial drsg remains dry and intact.  VSS. E link notified. Will cont to monitor closely.

## 2014-09-04 NOTE — Progress Notes (Signed)
Subjective:  Doing well he denies any further chest pain, slept well. No shortness of breath.  Objective:  Vital Signs in the last 24 hours: Temp:  [97.6 F (36.4 C)-98.2 F (36.8 C)] 97.9 F (36.6 C) (10/25 0730) Pulse Rate:  [53-79] 65 (10/25 0900) Resp:  [11-18] 18 (10/25 0900) BP: (111-179)/(52-98) 111/66 mmHg (10/25 0900) SpO2:  [93 %-99 %] 95 % (10/25 0900) Weight:  [81 kg (178 lb 9.2 oz)] 81 kg (178 lb 9.2 oz) (10/24 1450)  Intake/Output from previous day: 10/24 0701 - 10/25 0700 In: 5364 [P.O.:600; I.V.:729] Out: 1325 [Urine:1325]  Physical Exam:  General appearance: alert, cooperative, appears stated age and no distress  Eyes: negative findings: lids and lashes normal and conjunctivae and sclerae normal  Neck: no adenopathy, no carotid bruit, no JVD, supple, symmetrical, trachea midline and thyroid not enlarged, symmetric, no tenderness/mass/nodules  Neck: JVP - normal, carotids 2+= without bruits  Resp: clear to auscultation bilaterally  Chest wall: no tenderness  Cardio: regular rate and rhythm, S1, S2 normal, no murmur, click, rub or gallop  GI: soft, non-tender; bowel sounds normal; no masses, no organomegaly  Extremities: extremities normal, atraumatic, no cyanosis or edema  Pulses: 2+ and symmetric, right radial arterial access site without any complication. Lab Results: BMP  Recent Labs  09/03/14 1510 09/03/14 1700 09/04/14 0400  NA 132* 132* 136*  K 3.9 4.3 3.5*  CL 99 97 98  CO2 21 23 22   GLUCOSE 121* 130* 149*  BUN 12 11 10   CREATININE 0.97 0.84 0.88  CALCIUM 8.1* 8.8 8.5  GFRNONAA 83* 88* 86*  GFRAA >90 >90 >90    CBC  Recent Labs Lab 09/03/14 1700 09/04/14 0400  WBC 11.2* 9.2  RBC 4.77 4.45  HGB 14.7 13.5  HCT 42.7 40.2  PLT 318 302  MCV 89.5 90.3  MCH 30.8 30.3  MCHC 34.4 33.6  RDW 13.0 13.2  LYMPHSABS 0.9  --   MONOABS 0.5  --   EOSABS 0.0  --   BASOSABS 0.0  --     HEMOGLOBIN A1C Lab Results  Component Value Date   HGBA1C 5.9* 09/03/2014   MPG 123* 09/03/2014    Cardiac Panel (last 3 results)  Recent Labs  09/03/14 1510 09/03/14 1700 09/03/14 2137 09/04/14 0400  CKTOTAL 241*  --   --   --   CKMB 24.0*  --   --   --   TROPONINI 1.34* 11.27* >20.00* >20.00*  RELINDX 10.0*  --   --   --     BNP (last 3 results) No results found for this basename: PROBNP,  in the last 8760 hours  TSH  Recent Labs  09/03/14 1700 09/04/14 0400  TSH 6.310* 2.890    CHOLESTEROL  Recent Labs  09/03/14 1510  CHOL 233*   Lipid Panel     Component Value Date/Time   CHOL 233* 09/03/2014 1510   TRIG 95 09/03/2014 1510   HDL 33* 09/03/2014 1510   CHOLHDL 7.1 09/03/2014 1510   VLDL 19 09/03/2014 1510   LDLCALC 181* 09/03/2014 1510      Hepatic Function Panel  Recent Labs  09/03/14 1700  PROT 7.5  ALBUMIN 3.6  AST 80*  ALT 21  ALKPHOS 78  BILITOT 0.3    Cardiac Studies:  EKG: Normal sinus rhythm, T wave inversion in 1 and aVL V5 V6, more pronounced than yesterday, lateral wall ischemia. Prolonged QT interval.  Assessment/Plan:  1. Lateral wall ST elevation myocardial infarction,  successful angioplasty to culprit large OM1 with 2.5 x 18 mm Xience Alpine DES. CTO RCA. LVEF 50-55% with inferolateral hypokinesis. 2. Hyperlipidemia 3. Hyperglycemia Recommendation: Ambulate patient, transfer to telemetry, replace potassium, home tomorrow morning if stable.   Laverda Page, M.D. 09/04/2014, 10:16 AM [facility] Pager: [phone] Office: [phone] If no answer: [phone]

## 2014-09-05 ENCOUNTER — Other Ambulatory Visit: Payer: Self-pay

## 2014-09-05 LAB — POCT I-STAT, CHEM 8
BUN: 11 mg/dL (ref 6–23)
Calcium, Ion: 1.14 mmol/L (ref 1.13–1.30)
Chloride: 102 mEq/L (ref 96–112)
Creatinine, Ser: 0.9 mg/dL (ref 0.50–1.35)
Glucose, Bld: 122 mg/dL — ABNORMAL HIGH (ref 70–99)
HCT: 39 % (ref 39.0–52.0)
Hemoglobin: 13.3 g/dL (ref 13.0–17.0)
Potassium: 3.8 mEq/L (ref 3.7–5.3)
Sodium: 136 mEq/L — ABNORMAL LOW (ref 137–147)
TCO2: 21 mmol/L (ref 0–100)

## 2014-09-05 LAB — BASIC METABOLIC PANEL
Anion gap: 12 (ref 5–15)
BUN: 15 mg/dL (ref 6–23)
CO2: 25 mEq/L (ref 19–32)
Calcium: 8.9 mg/dL (ref 8.4–10.5)
Chloride: 104 mEq/L (ref 96–112)
Creatinine, Ser: 1.06 mg/dL (ref 0.50–1.35)
GFR calc Af Amer: 81 mL/min — ABNORMAL LOW (ref >90)
GFR calc non Af Amer: 70 mL/min — ABNORMAL LOW (ref >90)
Glucose, Bld: 106 mg/dL — ABNORMAL HIGH (ref 70–99)
Potassium: 4.6 mEq/L (ref 3.7–5.3)
Sodium: 141 mEq/L (ref 137–147)

## 2014-09-05 LAB — POCT ACTIVATED CLOTTING TIME
Activated Clotting Time: 214 seconds
Activated Clotting Time: 236 seconds
Activated Clotting Time: 433 seconds

## 2014-09-05 LAB — TROPONIN I: Troponin I: >20 ng/mL (ref <0.30)

## 2014-09-05 MED ORDER — PRASUGREL HCL 10 MG PO TABS: 10.0000 mg | ORAL_TABLET | Freq: Every day | ORAL | Status: DC

## 2014-09-05 MED ORDER — ASPIRIN 81 MG PO TBEC: 81.0000 mg | DELAYED_RELEASE_TABLET | Freq: Every day | ORAL | Status: DC

## 2014-09-05 MED ORDER — LISINOPRIL 10 MG PO TABS: 10.0000 mg | ORAL_TABLET | Freq: Every day | ORAL | Status: DC

## 2014-09-05 MED ORDER — NITROGLYCERIN 0.4 MG SL SUBL: 0.4000 mg | SUBLINGUAL_TABLET | SUBLINGUAL | Status: DC | PRN

## 2014-09-05 MED ORDER — ATORVASTATIN CALCIUM 80 MG PO TABS: 80.0000 mg | ORAL_TABLET | Freq: Every day | ORAL | Status: DC

## 2014-09-05 MED ORDER — CARVEDILOL 6.25 MG PO TABS: 6.2500 mg | ORAL_TABLET | Freq: Two times a day (BID) | ORAL | Status: DC

## 2014-09-05 MED FILL — Sodium Chloride IV Soln 0.9%: INTRAVENOUS | Qty: 50 | Status: AC

## 2014-09-05 NOTE — Care Management Note (Signed)
    Page 1 of 1   09/05/2014     11:22:48 AM CARE MANAGEMENT NOTE 09/05/2014  Patient:  Antonio Lawrence, Antonio Lawrence   Account Number:  [id number]  Date Initiated:  09/05/2014  Documentation initiated by:  GRAVES-BIGELOW,Deborra Phegley  Subjective/Objective Assessment:   Pt admitted for CP- Stemi. Plan for d/c today on effient. Pt uses [facility] in [place].     Action/Plan:   CM did provide pt with effient 30 day free/ co pay card. No further needs from CM at this time.   Anticipated DC Date:  09/05/2014   Anticipated DC Plan:  [place]  CM consult  Medication Assistance      Choice offered to / List presented to:             Status of service:  Completed, signed off Medicare Important Message given?  NA - LOS <3 / Initial given by admissions (If response is "NO", the following Medicare IM given date fields will be blank) Date Medicare IM given:   Medicare IM given by:   Date Additional Medicare IM given:   Additional Medicare IM given by:    Discharge Disposition:  HOME/SELF CARE  Per UR Regulation:  Reviewed for med. necessity/level of care/duration of stay  If discussed at [place] of Stay Meetings, dates discussed:    Comments:

## 2014-09-05 NOTE — Progress Notes (Signed)
CARDIAC REHAB PHASE I   PRE:  Rate/Rhythm: 80 SR  BP:  Supine:   Sitting: 140/60  Standing:    SaO2: 95 RA  MODE:  Ambulation: 950 ft   POST:  Rate/Rhythm: 80  BP:  Supine:   Sitting: 160/80  Standing:    SaO2: 98 RA 0800-0920 Pt tolerated ambulation well without c/o of cp or SOB. BP after walk 160/80. Pt back to chair after walk with call light in reach. Completed MI and stent education with pt. He voices understanding. Pt declines Outpt. CRP. He wants to exercise on his own. Pt takes care of his 26 year old mother and feels that it will be easier for him to exercise on his own.  Rodney Langton RN 09/05/2014 9:19 AM

## 2014-09-05 NOTE — Progress Notes (Signed)
UR Completed Kinzie Wickes Graves-Bigelow, RN,BSN [phone]  

## 2014-09-05 NOTE — Discharge Instructions (Addendum)
Cardiac Diet This diet can help prevent heart disease and stroke. Many factors influence your heart health, including eating and exercise habits. Coronary risk rises a lot with abnormal blood fat (lipid) levels. Cardiac meal planning includes limiting unhealthy fats, increasing healthy fats, and making other small dietary changes. General guidelines are as follows:  Adjust calorie intake to reach and maintain desirable body weight.  Limit total fat intake to less than 30% of total calories. Saturated fat should be less than 7% of calories.  Saturated fats are found in animal products and in some vegetable products. Saturated vegetable fats are found in coconut oil, cocoa butter, palm oil, and palm kernel oil. Read labels carefully to avoid these products as much as possible. Use butter in moderation. Choose tub margarines and oils that have 2 grams of fat or less. Good cooking oils are canola and olive oils.  Practice low-fat cooking techniques. Do not fry food. Instead, broil, bake, boil, steam, grill, roast on a rack, stir-fry, or microwave it. Other fat reducing suggestions include:  Remove the skin from poultry.  Remove all visible fat from meats.  Skim the fat off stews, soups, and gravies before serving them.  Steam vegetables in water or broth instead of sauting them in fat.  Avoid foods with trans fat (or hydrogenated oils), such as commercially fried foods and commercially baked goods. Commercial shortening and deep-frying fats will contain trans fat.  Increase intake of fruits, vegetables, whole grains, and legumes to replace foods high in fat.  Increase consumption of nuts, legumes, and seeds to at least 4 servings weekly. One serving of a legume equals  cup, and 1 serving of nuts or seeds equals  cup.  Choose whole grains more often. Have 3 servings per day (a serving is 1 ounce [oz]).  Eat 4 to 5 servings of vegetables per day. A serving of vegetables is 1 cup of raw leafy  vegetables;  cup of raw or cooked cut-up vegetables;  cup of vegetable juice.  Eat 4 to 5 servings of fruit per day. A serving of fruit is 1 medium whole fruit;  cup of dried fruit;  cup of fresh, frozen, or canned fruit;  cup of 100% fruit juice.  Increase your intake of dietary fiber to 20 to 30 grams per day. Insoluble fiber may help lower your risk of heart disease and may help curb your appetite.  Soluble fiber binds cholesterol to be removed from the blood. Foods high in soluble fiber are dried beans, citrus fruits, oats, apples, bananas, broccoli, Brussels sprouts, and eggplant.  Try to include foods fortified with plant sterols or stanols, such as yogurt, breads, juices, or margarines. Choose several fortified foods to achieve a daily intake of 2 to 3 grams of plant sterols or stanols.  Foods with omega-3 fats can help reduce your risk of heart disease. Aim to have a 3.5 oz portion of fatty fish twice per week, such as salmon, mackerel, albacore tuna, sardines, lake trout, or herring. If you wish to take a fish oil supplement, choose one that contains 1 gram of both DHA and EPA.  Limit processed meats to 2 servings (3 oz portion) weekly.  Limit the sodium in your diet to 1500 milligrams (mg) per day. If you have high blood pressure, talk to a registered dietitian about a DASH (Dietary Approaches to Stop Hypertension) eating plan.  Limit sweets and beverages with added sugar, such as soda, to no more than 5 servings per week. One  serving is:   1 tablespoon sugar.  1 tablespoon jelly or jam.   cup sorbet.  1 cup lemonade.   cup regular soda. CHOOSING FOODS Starches  Allowed: Breads: All kinds (wheat, rye, raisin, white, oatmeal, [place], [place], and English muffin bread). Low-fat rolls: English muffins, frankfurter and hamburger buns, bagels, pita bread, tortillas (not fried). Pancakes, waffles, biscuits, and muffins made with recommended oil.  Avoid: Products made with  saturated or trans fats, oils, or whole milk products. Butter rolls, cheese breads, croissants. Commercial doughnuts, muffins, sweet rolls, biscuits, waffles, pancakes, store-bought mixes. Crackers  Allowed: Low-fat crackers and snacks: Animal, graham, rye, saltine (with recommended oil, no lard), oyster, and matzo crackers. Bread sticks, melba toast, rusks, flatbread, pretzels, and light popcorn.  Avoid: High-fat crackers: cheese crackers, butter crackers, and those made with coconut, palm oil, or trans fat (hydrogenated oils). Buttered popcorn. Cereals  Allowed: Hot or cold whole-grain cereals.  Avoid: Cereals containing coconut, hydrogenated vegetable fat, or animal fat. Potatoes / Pasta / Rice  Allowed: All kinds of potatoes, rice, and pasta (such as macaroni, spaghetti, and noodles).  Avoid: Pasta or rice prepared with cream sauce or high-fat cheese. Chow mein noodles, [place] fries. Vegetables  Allowed: All vegetables and vegetable juices.  Avoid: Fried vegetables. Vegetables in cream, butter, or high-fat cheese sauces. Limit coconut. Fruit in cream or custard. Protein  Allowed: Limit your intake of meat, seafood, and poultry to no more than 6 oz (cooked weight) per day. All lean, well-trimmed beef, veal, pork, and lamb. All chicken and [place] without skin. All fish and shellfish. Wild game: wild duck, rabbit, pheasant, and venison. Egg whites or low-cholesterol egg substitutes may be used as desired. Meatless dishes: recipes with dried beans, peas, lentils, and tofu (soybean curd). Seeds and nuts: all seeds and most nuts.  Avoid: Prime grade and other heavily marbled and fatty meats, such as short ribs, spare ribs, rib eye roast or steak, frankfurters, sausage, bacon, and high-fat luncheon meats, mutton. Caviar. Commercially fried fish. Domestic duck, goose, venison sausage. Organ meats: liver, gizzard, heart, chitterlings, brains, kidney, sweetbreads. Dairy  Allowed: Low-fat  cheeses: nonfat or low-fat cottage cheese (1% or 2% fat), cheeses made with part skim milk, such as mozzarella, farmers, string, or ricotta. (Cheeses should be labeled no more than 2 to 6 grams fat per oz.). Skim (or 1%) milk: liquid, powdered, or evaporated. Buttermilk made with low-fat milk. Drinks made with skim or low-fat milk or cocoa. Chocolate milk or cocoa made with skim or low-fat (1%) milk. Nonfat or low-fat yogurt.  Avoid: Whole milk cheeses, including colby, cheddar, [place], Monterey Jack, [place], [place], [place], American, Swiss, and blue. Creamed cottage cheese, cream cheese. Whole milk and whole milk products, including buttermilk or yogurt made from whole milk, drinks made from whole milk. Condensed milk, evaporated whole milk, and 2% milk. Soups and Combination Foods  Allowed: Low-fat low-sodium soups: broth, dehydrated soups, homemade broth, soups with the fat removed, homemade cream soups made with skim or low-fat milk. Low-fat spaghetti, lasagna, chili, and Spanish rice if low-fat ingredients and low-fat cooking techniques are used.  Avoid: Cream soups made with whole milk, cream, or high-fat cheese. All other soups. Desserts and Sweets  Allowed: Sherbet, fruit ices, gelatins, meringues, and angel food cake. Homemade desserts with recommended fats, oils, and milk products. Jam, jelly, honey, marmalade, sugars, and syrups. Pure sugar candy, such as gum drops, hard candy, jelly beans, marshmallows, mints, and small amounts of dark chocolate.  Avoid: Commercially prepared  cakes, pies, cookies, frosting, pudding, or mixes for these products. Desserts containing whole milk products, chocolate, coconut, lard, palm oil, or palm kernel oil. Ice cream or ice cream drinks. Candy that contains chocolate, coconut, butter, hydrogenated fat, or unknown ingredients. Buttered syrups. Fats and Oils  Allowed: Vegetable oils: safflower, sunflower, corn, soybean, cottonseed, sesame, canola, olive,  or peanut. Non-hydrogenated margarines. Salad dressing or mayonnaise: homemade or commercial, made with a recommended oil. Low or nonfat salad dressing or mayonnaise.  Limit added fats and oils to 6 to 8 tsp per day (includes fats used in cooking, baking, salads, and spreads on bread). Remember to count the "hidden fats" in foods.  Avoid: Solid fats and shortenings: butter, lard, salt pork, bacon drippings. Gravy containing meat fat, shortening, or suet. Cocoa butter, coconut. Coconut oil, palm oil, palm kernel oil, or hydrogenated oils: these ingredients are often used in bakery products, nondairy creamers, whipped toppings, candy, and commercially fried foods. Read labels carefully. Salad dressings made of unknown oils, sour cream, or cheese, such as blue cheese and Roquefort. Cream, all kinds: half-and-half, light, heavy, or whipping. Sour cream or cream cheese (even if "light" or low-fat). Nondairy cream substitutes: coffee creamers and sour cream substitutes made with palm, palm kernel, hydrogenated oils, or coconut oil. Beverages  Allowed: Coffee (regular or decaffeinated), tea. Diet carbonated beverages, mineral water. Alcohol: Check with your caregiver. Moderation is recommended.  Avoid: Whole milk, regular sodas, and juice drinks with added sugar. Condiments  Allowed: All seasonings and condiments. Cocoa powder. "Cream" sauces made with recommended ingredients.  Avoid: Carob powder made with hydrogenated fats. SAMPLE MENU Breakfast   cup orange juice   cup oatmeal  1 slice toast  1 tsp margarine  1 cup skim milk Lunch  [place] sandwich with 2 oz [place], 2 slices bread  Lettuce and tomato slices  Fresh fruit  Carrot sticks  Coffee or tea Snack  Fresh fruit or low-fat crackers Dinner  3 oz lean ground beef  1 baked potato  1 tsp margarine   cup asparagus  Lettuce salad  1 tbs non-creamy dressing   cup peach slices  1 cup skim milk Document Released:  08/06/2008 Document Revised: 04/28/2012 Document Reviewed: 12/28/2013 Exit[employer] Patient Information 2015 [place], [place]. This information is not intended to replace advice given to you by your health care provider. Make sure you discuss any questions you have with your health care provider.   Prasugrel oral tablets What is this medicine? PRASUGREL (PRA soo grel) helps to prevent blood clots. This medicine is used to prevent heart attack, stroke, or other vascular events in people who are at high risk. This medicine may be used for other purposes; ask your health care provider or pharmacist if you have questions. COMMON BRAND NAME(S): Effient What should I tell my health care provider before I take this medicine? They need to know if you have any of these conditions: -bleeding disorders -kidney disease -liver disease -recent trauma or surgery -stomach or intestinal ulcers -stroke or transient ischemic attack -an unusual or allergic reaction to prasugrel, other medicines, foods, dyes, or preservatives -pregnant or trying to get pregnant -breast-feeding How should I use this medicine? Take this medicine by mouth with a drink of water. Follow the directions on the prescription label. You may take this medicine with or without food. If it upsets your stomach, take it with food. This medicine may be chewed or it may be crushed and put into food or liquids such as applesauce, juice,  or water as long as it is taken immediately. This medicine has a bitter taste that you may notice if it is chewed or crushed. Take your medicine at regular intervals. Do not take your medicine more often than directed. Do not stop taking except on your doctor's advice. Talk to your pediatrician regarding the use of this medicine in children. Special care may be needed. Overdosage: If you think you have taken too much of this medicine contact a poison control center or emergency room at once. NOTE: This medicine is only  for you. Do not share this medicine with others. What if I miss a dose? If you miss a dose, take it as soon as you can. If it is almost time for your next dose, take only that dose. Do not take double or extra doses. What may interact with this medicine? -aspirin -certain medicines that treat or prevent blood clots like warfarin, enoxaparin, and dalteparin -NSAIDS, medicines for pain and inflammation, like ibuprofen or naproxen This list may not describe all possible interactions. Give your health care provider a list of all the medicines, herbs, non-prescription drugs, or dietary supplements you use. Also tell them if you smoke, drink alcohol, or use illegal drugs. Some items may interact with your medicine. What should I watch for while using this medicine? Visit your doctor or health care professional for regular check ups. Do not stop taking your medicine unless your doctor tells you to. Notify your doctor or health care professional and seek emergency treatment if you develop breathing problems; changes in vision; chest pain; severe, sudden headache; pain, swelling, warmth in the leg; trouble speaking; sudden numbness or weakness of the face, arm, or leg. These can be signs that your condition has gotten worse. If you are going to have surgery or dental work, tell your doctor or health care professional that you are taking this medicine. What side effects may I notice from receiving this medicine? Side effects that you should report to your doctor or health care professional as soon as possible: -allergic reactions like skin rash, itching or hives, swelling of the face, lips, or tongue -signs and symptoms of bleeding such as bloody or black, tarry stools; red or dark-brown urine; spitting up blood or brown material that looks like coffee grounds; red spots on the skin; unusual bruising or bleeding from the eye, gums, or nose Side effects that usually do not require medical attention (report to  your doctor or health care professional if they continue or are bothersome): -diarrhea -headache -nausea, vomiting -pain in back, arms or legs This list may not describe all possible side effects. Call your doctor for medical advice about side effects. You may report side effects to FDA at 1-800-FDA-1088. Where should I keep my medicine? Keep out of the reach of children. Store at room temperature between 15 and 30 degrees C (59 and 86 degrees F). Throw away any unused medicine after the expiration date. NOTE: This sheet is a summary. It may not cover all possible information. If you have questions about this medicine, talk to your doctor, pharmacist, or health care provider.  2015, [employer] Standard. (2013-02-23 16:39:26)

## 2014-09-06 DIAGNOSIS — E785 Hyperlipidemia, unspecified: Secondary | ICD-10-CM

## 2014-09-06 DIAGNOSIS — I10 Essential (primary) hypertension: Secondary | ICD-10-CM

## 2014-09-06 NOTE — Discharge Summary (Signed)
Physician Discharge Summary  Patient ID: Antonio Lawrence MRN: [id number] DOB/AGE: [id number] 68 y.o.  Admit date: 09/03/2014 Discharge date: 09/05/2014  Primary Discharge Diagnosis 1. Lateral wall ST elevation myocardial infarction, successful angioplasty to culprit large OM1 with 2.5 x 18 mm Xience Alpine DES. Has residual chronic total occlusion of  RCA. LVEF 50-55% with inferolateral hypokinesis.  2. Hyperlipidemia  3. Hyperglycemia  Significant Diagnostic Studies: History of lung cancer, squamous cell, status post right lower lobe lobectomy 1999. Quit smoking since then.  Hospital Course:  Patient is a 68 year-old Caucasian male with history of squamous cell lung carcinoma, status post right lower lobe lobectomy sometime in 1991, history of smoking which he quit after the diagnosis of lung cancer, otherwise doing well and not on any medications, started experiencing chest discomfort at 8 AM this morning on and off without any other associated symptoms. At around 12 p.m. the pain got intense, eventually around 1 to 1:30 or so he called EMS and was found to have ST elevation in 1 and aVL with ST depression in V1 and V2 suggestive of high lateral and posterior acute ST elevation myocardial infarction. Hence he was then taken emergently to the coronary angiography suite.   He was found to have chronically occluded a coronary artery and a high-grade stenosis of the OM1 branch of circumflex coronary artery for which he underwent successful DES placement. The following morning he was ambulated with help of cardiac rehabilitation, and watched for 48 hours and then eventually felt to be discharged home with outpatient follow-up. Patient remained asymptomatic.   He has a very large right coronary artery, that has collaterals from the left system, the lesion appears to be amenable for angioplasty, he'll be scheduled for elective angioplasty of the same at a later date.  Recommendations on discharge:  Patient was started on Effient and also high-dose statin for hyperlipidemia. He will need Effient and aspirin for at least 1 year or longer. He'll also be scheduled for outpatient cardiac rehabilitation.  Discharge Exam: Blood pressure 120/56, pulse 79, temperature 98.5 F (36.9 C), temperature source Oral, resp. rate 20, height 5\' 8"  (1.727 m), weight 79.334 kg (174 lb 14.4 oz), SpO2 93.00%.   General appearance: alert, cooperative, appears stated age and no distress  Eyes: negative findings: lids and lashes normal and conjunctivae and sclerae normal  Neck: no adenopathy, no carotid bruit, no JVD, supple, symmetrical, trachea midline and thyroid not enlarged, symmetric, no tenderness/mass/nodules  Neck: JVP - normal, carotids 2+= without bruits  Resp: clear to auscultation bilaterally  Chest wall: no tenderness  Cardio: regular rate and rhythm, S1, S2 normal, no murmur, click, rub or gallop  GI: soft, non-tender; bowel sounds normal; no masses, no organomegaly  Extremities: extremities normal, atraumatic, no cyanosis or edema  Pulses: 2+ and symmetric  Labs:   Lab Results  Component Value Date   WBC 9.2 09/04/2014   HGB 13.5 09/04/2014   HCT 40.2 09/04/2014   MCV 90.3 09/04/2014   PLT 302 09/04/2014    Recent Labs Lab 09/03/14 1700  09/05/14 0314  NA 132*  < > 141  K 4.3  < > 4.6  CL 97  < > 104  CO2 23  < > 25  BUN 11  < > 15  CREATININE 0.84  < > 1.06  CALCIUM 8.8  < > 8.9  PROT 7.5  --   --   BILITOT 0.3  --   --   ALKPHOS 78  --   --  ALT 21  --   --   AST 80*  --   --   GLUCOSE 130*  < > 106*  < > = values in this interval not displayed. Lab Results  Component Value Date   CKTOTAL 241* 09/03/2014   CKMB 24.0* 09/03/2014   TROPONINI >20.00* 09/05/2014    Lipid Panel     Component Value Date/Time   CHOL 233* 09/03/2014 1510   TRIG 95 09/03/2014 1510   HDL 33* 09/03/2014 1510   CHOLHDL 7.1 09/03/2014 1510   VLDL 19 09/03/2014 1510   LDLCALC 181*  09/03/2014 1510   Cardiac Panel (last 3 results)  Recent Labs  09/03/14 1510  09/03/14 2137 09/04/14 0400 09/05/14 0314  CKTOTAL 241*  --   --   --   --   CKMB 24.0*  --   --   --   --   TROPONINI 1.34*  < > >20.00* >20.00* >20.00*  RELINDX 10.0*  --   --   --   --   < > = values in this interval not displayed.   TSH was initially elevated at 6.310, however repeat labs revealed normal TSH at 2.890. T3 and T4 were also normal.  EKG: Normal sinus rhythm, T wave inversion in 1 and aVL V5 V6, more pronounced than yesterday, lateral wall ischemia. Prolonged QT interval.  FOLLOW UP PLANS AND APPOINTMENTS    Medication List         aspirin 81 MG EC tablet  Take 1 tablet (81 mg total) by mouth daily.     atorvastatin 80 MG tablet  Commonly known as:  LIPITOR  Take 1 tablet (80 mg total) by mouth daily at 6 PM.     carvedilol 6.25 MG tablet  Commonly known as:  COREG  Take 1 tablet (6.25 mg total) by mouth 2 (two) times daily with a meal.     lisinopril 10 MG tablet  Commonly known as:  PRINIVIL,ZESTRIL  Take 1 tablet (10 mg total) by mouth daily.     nitroGLYCERIN 0.4 MG SL tablet  Commonly known as:  NITROSTAT  Place 1 tablet (0.4 mg total) under the tongue every 5 (five) minutes x 3 doses as needed for chest pain.     prasugrel 10 MG Tabs tablet  Commonly known as:  EFFIENT  Take 1 tablet (10 mg total) by mouth daily.           Follow-up Information   Follow up with Laverda Page, MD On 09/14/2014. (1 PM appointment arrive 15-30 min prior, bring all medications)    Specialty:  Cardiology   Contact information:   [place] [place] [address] [phone]        Laverda Page, MD 09/06/2014, 10:57 AM  Pager: [phone] Office: [phone] If no answer: [phone]

## 2014-10-20 ENCOUNTER — Encounter (HOSPITAL_COMMUNITY): Payer: Self-pay | Admitting: Cardiology

## 2014-10-21 ENCOUNTER — Encounter: Payer: Self-pay | Admitting: Cardiovascular Disease

## 2014-10-21 ENCOUNTER — Ambulatory Visit (INDEPENDENT_AMBULATORY_CARE_PROVIDER_SITE_OTHER): Payer: Medicare HMO | Admitting: Cardiovascular Disease

## 2014-10-21 VITALS — BP 148/72 | HR 63 | Ht 68.0 in | Wt 182.8 lb

## 2014-10-21 DIAGNOSIS — I2119 ST elevation (STEMI) myocardial infarction involving other coronary artery of inferior wall: Secondary | ICD-10-CM

## 2014-10-21 DIAGNOSIS — E785 Hyperlipidemia, unspecified: Secondary | ICD-10-CM

## 2014-10-21 DIAGNOSIS — I2111 ST elevation (STEMI) myocardial infarction involving right coronary artery: Secondary | ICD-10-CM

## 2014-10-21 NOTE — Patient Instructions (Signed)
Your physician has requested that you have an echocardiogram in January 2016. Echocardiography is a painless test that uses sound waves to create images of your heart. It provides your doctor with information about the size and shape of your heart and how well your heart's chambers and valves are working. This procedure takes approximately one hour. There are no restrictions for this procedure.  Your physician recommends that you return for a FASTING LIPID and LIVER in January--nothing to eat or drink after midnight, lab opens at 7:30 AM  Your physician wants you to follow-up in: 4 MONTHS with Dr Burt Knack.  You will receive a reminder letter in the mail two months in advance. If you don't receive a letter, please call our office to schedule the follow-up appointment.

## 2014-10-21 NOTE — Progress Notes (Signed)
HPI:   68 year old gentleman presenting for cardiac evaluation. The patient has coronary artery disease. He presented 09/03/2014 with an inferoposterior STEMI. He was taken emergently to the cardiac catheterization lab by Dr. [name]. He underwent primary PCI of the first obtuse marginal branch of the left circumflex. He was also noted to have occlusion of the right coronary artery. An attempt to reopen this vessel was made. However, even with backup support the occlusion could not be crossed. It was presumed to be a chronic total occlusion. There were left-to-right collaterals noted. The LAD also had moderate diffuse disease with plans for medical therapy noted. The patient had an uncomplicated post MI course. His LVEF was estimated at 50-55% with inferolateral hypokinesis.  The patient wishes to transition his care to this practice. This is my first encounter with him. I take care of his father-in-law. The patient is doing well at present. He denies chest pain, chest pressure, shortness of breath, edema, or heart palpitations. He is back to work. He is not interested in cardiac rehabilitation. He has mild fatigue but otherwise no specific complaints. He denies leg swelling, orthopnea, PND, or heart palpitations.  Outpatient Encounter Prescriptions as of 10/21/2014  Medication Sig  . aspirin EC 81 MG EC tablet Take 1 tablet (81 mg total) by mouth daily.  Marland Kitchen atorvastatin (LIPITOR) 80 MG tablet Take 1 tablet (80 mg total) by mouth daily at 6 PM.  . carvedilol (COREG) 6.25 MG tablet Take 1 tablet (6.25 mg total) by mouth 2 (two) times daily with a meal.  . lisinopril (PRINIVIL,ZESTRIL) 10 MG tablet Take 1 tablet (10 mg total) by mouth daily.  . nitroGLYCERIN (NITROSTAT) 0.4 MG SL tablet Place 1 tablet (0.4 mg total) under the tongue every 5 (five) minutes x 3 doses as needed for chest pain.  . prasugrel (EFFIENT) 10 MG TABS tablet Take 1 tablet (10 mg total) by mouth daily.    Review of patient's  allergies indicates no known allergies.  Past Medical History  Diagnosis Date  . Lung cancer   . Coronary artery disease   . Hypertension   . Hyperlipidemia   . Hyperglycemia   . Myocardial infarction     Past Surgical History  Procedure Laterality Date  . Lobectomy    . Left heart cath Bilateral 09/03/2014    Procedure: LEFT HEART CATH;  Surgeon: Laverda Page, MD;  Location: [facility] CATH LAB;  Service: Cardiovascular;  Laterality: Bilateral;    History   Social History  . Marital Status: Married    Spouse Name: N/A    Number of Children: N/A  . Years of Education: N/A   Occupational History  . Not on file.   Social History Main Topics  . Smoking status: Former Research scientist (life sciences)  . Smokeless tobacco: Not on file  . Alcohol Use: No  . Drug Use: No  . Sexual Activity: Yes   Other Topics Concern  . Not on file   Social History Narrative    Family History  Problem Relation Age of Onset  . Heart attack Mother   . Cancer Father     ROS:  General: no fevers/chills/night sweats Eyes: no blurry vision, diplopia, or amaurosis ENT: no sore throat or hearing loss Resp: no cough, wheezing, or hemoptysis CV: no edema or palpitations GI: no abdominal pain, nausea, vomiting, diarrhea, or constipation GU: no dysuria, frequency, or hematuria Skin: no rash Neuro: no headache, numbness, tingling, or weakness of extremities Musculoskeletal: no joint pain or swelling  Heme: no bleeding, DVT, or easy bruising Endo: no polydipsia or polyuria  BP 148/72 mmHg  Pulse 63  Ht 5\' 8"  (1.727 m)  Wt 182 lb 12 oz (82.895 kg)  BMI 27.79 kg/m2  PHYSICAL EXAM: Pt is alert and oriented, WD, WN, in no distress. HEENT: normal Neck: JVP normal. Carotid upstrokes normal without bruits. No thyromegaly. Lungs: equal expansion, clear bilaterally CV: Apex is discrete and nondisplaced, RRR without murmur or gallop Abd: soft, NT, +BS, no bruit, no hepatosplenomegaly Back: no CVA tenderness Ext: no  C/C/E        DP/PT pulses intact and = Skin: warm and dry without rash Neuro: CNII-XII intact             Strength intact = bilaterally  EKG:  EKG tracing reviewed from 09/16/2014. This demonstrates normal sinus rhythm with T-wave abnormality consider lateral ischemia.  ASSESSMENT AND PLAN: 1. Coronary artery disease with inferoposterior myocardial infarction. I personally reviewed the patient's cardiac catheterization films. He had a nice angiographic result of his culprit lesion. The patient has no significant anginal symptoms, and I think his chronic RCA occlusion can be treated medically. I have recommended a 2-D echocardiogram to evaluate LV function. His medications were reviewed and they are appropriate. He will continue the same. I will see him back in 4 months. He should be on dual antiplatelet therapy with aspirin and Effient for at least 12 months. We discussed his diagnosis of coronary artery disease, MI, and management strategies as well as prognostic issues at length.  2. Essential hypertension. Blood pressure has been well controlled. His blood pressure is mildly elevated at today's visit. However, at his other recent office visit his blood pressure was 122/62. Will continue carvedilol and lisinopril at current doses.  3. Hyperlipidemia. Labs were reviewed and his LDL was markedly elevated. He continues on atorvastatin 80 mg daily. Will repeat labs in January when he comes in for his echocardiogram.  For follow-up I will see him back in 4 months. As long as he remains asymptomatic, anticipate annual follow-up thereafter.  Sherren Mocha, MD 10/21/2014 5:11 PM

## 2014-11-25 ENCOUNTER — Ambulatory Visit (HOSPITAL_COMMUNITY): Payer: Medicare HMO | Attending: Cardiovascular Disease | Admitting: Cardiology

## 2014-11-25 ENCOUNTER — Other Ambulatory Visit (INDEPENDENT_AMBULATORY_CARE_PROVIDER_SITE_OTHER): Payer: Medicare HMO | Admitting: *Deleted

## 2014-11-25 DIAGNOSIS — I2119 ST elevation (STEMI) myocardial infarction involving other coronary artery of inferior wall: Secondary | ICD-10-CM

## 2014-11-25 DIAGNOSIS — I2111 ST elevation (STEMI) myocardial infarction involving right coronary artery: Secondary | ICD-10-CM | POA: Diagnosis not present

## 2014-11-25 DIAGNOSIS — I2129 ST elevation (STEMI) myocardial infarction involving other sites: Secondary | ICD-10-CM | POA: Diagnosis present

## 2014-11-25 DIAGNOSIS — E785 Hyperlipidemia, unspecified: Secondary | ICD-10-CM | POA: Diagnosis not present

## 2014-11-25 LAB — LIPID PANEL
Cholesterol: 96 mg/dL (ref 0–200)
HDL: 32.9 mg/dL — ABNORMAL LOW (ref >39.00)
LDL Cholesterol: 47 mg/dL (ref 0–99)
NonHDL: 63.1
Total CHOL/HDL Ratio: 3
Triglycerides: 79 mg/dL (ref 0.0–149.0)
VLDL: 15.8 mg/dL (ref 0.0–40.0)

## 2014-11-25 LAB — HEPATIC FUNCTION PANEL
ALT: 20 U/L (ref 0–53)
AST: 20 U/L (ref 0–37)
Albumin: 3.5 g/dL (ref 3.5–5.2)
Alkaline Phosphatase: 76 U/L (ref 39–117)
Bilirubin, Direct: 0 mg/dL (ref 0.0–0.3)
Total Bilirubin: 0.4 mg/dL (ref 0.2–1.2)
Total Protein: 6.8 g/dL (ref 6.0–8.3)

## 2014-11-25 NOTE — Progress Notes (Signed)
Echo performed. 

## 2014-11-30 ENCOUNTER — Telehealth: Payer: Self-pay | Admitting: Cardiovascular Disease

## 2014-11-30 NOTE — Telephone Encounter (Signed)
Left message on machine for pt's wife to contact the office.   

## 2014-11-30 NOTE — Telephone Encounter (Signed)
I spoke with the pt's wife and made her aware of echo and lab results.

## 2014-11-30 NOTE — Telephone Encounter (Signed)
Follow Up  Pt wife called back. Please call

## 2014-12-13 ENCOUNTER — Telehealth: Payer: Self-pay

## 2014-12-13 ENCOUNTER — Telehealth: Payer: Self-pay | Admitting: Cardiovascular Disease

## 2014-12-13 NOTE — Telephone Encounter (Signed)
Patient's wife called to get an copay card for effeint placed up front

## 2014-12-13 NOTE — Telephone Encounter (Signed)
Error. Called for EFFIENT card,transferred to Refills

## 2015-01-02 ENCOUNTER — Telehealth: Payer: Self-pay | Admitting: Cardiovascular Disease

## 2015-01-02 ENCOUNTER — Telehealth: Payer: Self-pay

## 2015-01-02 NOTE — Telephone Encounter (Signed)
LMTCB

## 2015-01-02 NOTE — Telephone Encounter (Signed)
Patient's wife called and states that patient's effient was going to cost 200 dollars and wanted to know if there was something else cheaper. Or maybe get a tier exp done if possible. She also wanted you to know that the patient was complaining of no energy for 2 wks and he thinks that it is coming from the Lipitor. Please call her at [phone]. Also gave samples of effient

## 2015-01-02 NOTE — Telephone Encounter (Signed)
New Message   Pt wife calling, wanting to speak w/ Rn about pt's lipitor- pt stating that it is taking away his energy. Please call back and discuss.

## 2015-01-03 NOTE — Telephone Encounter (Signed)
Follow Up       Pt's wife calling stating that pt has no energy and wants to speak with Lauren. Please call back.

## 2015-01-03 NOTE — Telephone Encounter (Signed)
Please see telephone encounter with documentation in regards to the pt having a hemoglobin of 6.7 per [name] office.

## 2015-01-03 NOTE — Telephone Encounter (Signed)
Follow Up         Pt's wife calling back stating that pt saw Dr. Redmond Pulling yesterday and their office called them today and stated that pt's hemoglobin was 6.7 and they are stating that pt needs to see a GI specialist as well as have a blood transfusion. Pt's wife wants to discuss this with Ander Purpura and find out what Dr. Burt Knack thinks about this. Please call back and advise.

## 2015-01-03 NOTE — Telephone Encounter (Signed)
I spoke with Diane and she said the pt saw Dr Redmond Pulling yesterday due to fatigue and SOB.  The pt had lab work and a chest x-ray performed.  She states [name] office called this morning to let her know the pt's hemoglobin was 6.7.  [name] office tried to arrange admission to short stay for blood transfusion but they did not have availability today. The pt has been scheduled with Dr Benson Norway in GI tomorrow at 26 AM. [name] office told the pt that Dr Benson Norway could arrange transfusion in a more timely manner.  Diane has already contacted Dr Ulyses Amor office and they said they would further evaluate the pt for an endoscopy and colonoscopy. Diane would like to know what the pt needs to do in regards to ASA and Effient. I will discuss this pt further with Dr Burt Knack.

## 2015-01-03 NOTE — Telephone Encounter (Signed)
Will likely need to stop effient. Happy to review case to help with recommendations when he sees Dr Benson Norway tomorrow. thx  Sherren Mocha 01/03/2015 2:39 PM

## 2015-01-03 NOTE — Telephone Encounter (Signed)
Follow Up  Pt wife wanting to speak w/ LAuren about pt energy level. Please call back and discuss.

## 2015-01-03 NOTE — Telephone Encounter (Signed)
I spoke with Antonio Lawrence and made her aware Dr Antionette Char recommendation.

## 2015-01-04 ENCOUNTER — Telehealth: Payer: Self-pay | Admitting: Cardiovascular Disease

## 2015-01-04 ENCOUNTER — Other Ambulatory Visit (HOSPITAL_COMMUNITY): Payer: Self-pay | Admitting: *Deleted

## 2015-01-04 ENCOUNTER — Other Ambulatory Visit: Payer: Self-pay | Admitting: Gastroenterology

## 2015-01-04 ENCOUNTER — Encounter (HOSPITAL_COMMUNITY): Payer: Self-pay | Admitting: *Deleted

## 2015-01-04 NOTE — Telephone Encounter (Signed)
Thx. Dr Benson Norway sent me a note as well. He plans to continue antiplatelet drugs and do endoscopy 3/4

## 2015-01-04 NOTE — Telephone Encounter (Signed)
Follow up pt wife returned call

## 2015-01-04 NOTE — Telephone Encounter (Signed)
Left message on machine for Antonio Lawrence to contact the office.

## 2015-01-04 NOTE — Telephone Encounter (Signed)
I spoke with Diane and she wanted to make [place] aware that the pt is going for type and cross this Friday and is scheduled for blood transfusion on 01/09/15. The pt is also scheduled for endoscopy and colonoscopy on 01/13/15. I will make Dr Burt Knack aware of this information.

## 2015-01-04 NOTE — Telephone Encounter (Signed)
Follow up:   Pt would like a call back about pt's visit with other provider today .   Please give her a call back.

## 2015-01-06 ENCOUNTER — Ambulatory Visit (HOSPITAL_COMMUNITY)
Admission: RE | Admit: 2015-01-06 | Discharge: 2015-01-06 | Disposition: A | Discharge status: Home / Self Care | Payer: Medicare HMO | Source: Ambulatory Visit | Attending: Gastroenterology | Admitting: Gastroenterology

## 2015-01-06 ENCOUNTER — Encounter (HOSPITAL_COMMUNITY): Payer: Medicare HMO

## 2015-01-06 DIAGNOSIS — D649 Anemia, unspecified: Secondary | ICD-10-CM | POA: Insufficient documentation

## 2015-01-06 LAB — PREPARE RBC (CROSSMATCH)

## 2015-01-06 LAB — ABO/RH: ABO/RH(D): B POS

## 2015-01-09 ENCOUNTER — Encounter (HOSPITAL_COMMUNITY): Payer: Medicare HMO

## 2015-01-09 ENCOUNTER — Encounter (HOSPITAL_COMMUNITY)
Admission: RE | Admit: 2015-01-09 | Discharge: 2015-01-09 | Disposition: A | Discharge status: Home / Self Care | Payer: Medicare HMO | Source: Ambulatory Visit | Attending: Gastroenterology | Admitting: Gastroenterology

## 2015-01-09 DIAGNOSIS — D649 Anemia, unspecified: Secondary | ICD-10-CM | POA: Insufficient documentation

## 2015-01-09 MED ORDER — SODIUM CHLORIDE 0.9 % IV SOLN
Freq: Once | INTRAVENOUS | Status: AC
  Administered 2015-01-09: 250 mL via INTRAVENOUS

## 2015-01-10 LAB — TYPE AND SCREEN
ABO/RH(D): B POS
Antibody Screen: NEGATIVE
Unit division: 0
Unit division: 0

## 2015-01-13 ENCOUNTER — Encounter (HOSPITAL_COMMUNITY)
Admission: RE | Disposition: A | Discharge status: Home / Self Care | Payer: Self-pay | Source: Ambulatory Visit | Attending: Gastroenterology

## 2015-01-13 ENCOUNTER — Encounter (HOSPITAL_COMMUNITY): Payer: Self-pay | Admitting: Gastroenterology

## 2015-01-13 ENCOUNTER — Ambulatory Visit (HOSPITAL_COMMUNITY): Payer: Medicare HMO | Admitting: Anesthesiology

## 2015-01-13 ENCOUNTER — Ambulatory Visit (HOSPITAL_COMMUNITY)
Admission: RE | Admit: 2015-01-13 | Discharge: 2015-01-13 | Disposition: A | Discharge status: Home / Self Care | Payer: Medicare HMO | Source: Ambulatory Visit | Attending: Gastroenterology | Admitting: Gastroenterology

## 2015-01-13 DIAGNOSIS — D649 Anemia, unspecified: Secondary | ICD-10-CM | POA: Diagnosis present

## 2015-01-13 DIAGNOSIS — Z902 Acquired absence of lung [part of]: Secondary | ICD-10-CM | POA: Diagnosis not present

## 2015-01-13 DIAGNOSIS — I2510 Atherosclerotic heart disease of native coronary artery without angina pectoris: Secondary | ICD-10-CM | POA: Diagnosis not present

## 2015-01-13 DIAGNOSIS — Z85118 Personal history of other malignant neoplasm of bronchus and lung: Secondary | ICD-10-CM | POA: Diagnosis not present

## 2015-01-13 DIAGNOSIS — K2980 Duodenitis without bleeding: Secondary | ICD-10-CM | POA: Insufficient documentation

## 2015-01-13 DIAGNOSIS — Z955 Presence of coronary angioplasty implant and graft: Secondary | ICD-10-CM | POA: Insufficient documentation

## 2015-01-13 DIAGNOSIS — Z87891 Personal history of nicotine dependence: Secondary | ICD-10-CM | POA: Insufficient documentation

## 2015-01-13 DIAGNOSIS — I10 Essential (primary) hypertension: Secondary | ICD-10-CM | POA: Diagnosis not present

## 2015-01-13 DIAGNOSIS — I252 Old myocardial infarction: Secondary | ICD-10-CM | POA: Insufficient documentation

## 2015-01-13 DIAGNOSIS — K2971 Gastritis, unspecified, with bleeding: Secondary | ICD-10-CM | POA: Diagnosis not present

## 2015-01-13 DIAGNOSIS — E785 Hyperlipidemia, unspecified: Secondary | ICD-10-CM | POA: Diagnosis not present

## 2015-01-13 HISTORY — PX: COLONOSCOPY WITH PROPOFOL: SHX5780

## 2015-01-13 HISTORY — PX: ENTEROSCOPY: SHX5533

## 2015-01-13 SURGERY — COLONOSCOPY WITH PROPOFOL
Anesthesia: Monitor Anesthesia Care

## 2015-01-13 MED ORDER — PROPOFOL 10 MG/ML IV BOLUS
INTRAVENOUS | Status: AC
  Filled 2015-01-13: qty 20

## 2015-01-13 MED ORDER — SODIUM CHLORIDE 0.9 % IV SOLN: INTRAVENOUS | Status: DC

## 2015-01-13 MED ORDER — KETAMINE HCL 10 MG/ML IJ SOLN
INTRAMUSCULAR | Status: DC | PRN
  Administered 2015-01-13 (×2): 10 mg via INTRAVENOUS

## 2015-01-13 MED ORDER — MIDAZOLAM HCL 2 MG/2ML IJ SOLN
INTRAMUSCULAR | Status: AC
  Filled 2015-01-13: qty 2

## 2015-01-13 MED ORDER — LIDOCAINE HCL (CARDIAC) 20 MG/ML IV SOLN
INTRAVENOUS | Status: AC
  Filled 2015-01-13: qty 5

## 2015-01-13 MED ORDER — LACTATED RINGERS IV SOLN
INTRAVENOUS | Status: DC
  Administered 2015-01-13: 1000 mL via INTRAVENOUS
  Administered 2015-01-13 (×2): via INTRAVENOUS

## 2015-01-13 MED ORDER — LIDOCAINE HCL 1 % IJ SOLN
INTRAMUSCULAR | Status: DC | PRN
  Administered 2015-01-13: 50 mg via INTRADERMAL

## 2015-01-13 MED ORDER — PROPOFOL INFUSION 10 MG/ML OPTIME
INTRAVENOUS | Status: DC | PRN
  Administered 2015-01-13: 180 ug/kg/min via INTRAVENOUS

## 2015-01-13 MED ORDER — MIDAZOLAM HCL 5 MG/5ML IJ SOLN
INTRAMUSCULAR | Status: DC | PRN
  Administered 2015-01-13 (×2): 1 mg via INTRAVENOUS

## 2015-01-13 SURGICAL SUPPLY — 25 items

## 2015-01-13 NOTE — Op Note (Signed)
[facility] [place], [address]   COLONOSCOPY PROCEDURE REPORT  PATIENT: Antonio, Lawrence  MR#: [id number] BIRTHDATE: 1946/04/29 , 68  yrs. old GENDER: male ENDOSCOPIST: Carol Ada, MD REFERRED BY: PROCEDURE DATE:  Jan 24, 2015 PROCEDURE:   Colonoscopy with snare polypectomy ASA CLASS:   Class III INDICATIONS:iron deficiency anemia. MEDICATIONS: Monitored anesthesia care  DESCRIPTION OF PROCEDURE:   After the risks and benefits and of the procedure were explained, informed consent was obtained.        The Pentax Ped Colon [id number]  endoscope was introduced through the anus and advanced to the cecum, which was identified by both the appendix and ileocecal valve .  The quality of the prep was excellent. .  The instrument was then slowly withdrawn as the colon was fully examined.   FINDINGS: In the cecum a large 1 cm linear polyp was identified adjacent to a 4 mm sessile polyp.  The larger polyp was removed with a hot snare and the smaller polyp was removed with a cold snare.  The larger polypectomy site exhibited some mild oozing and it was sealed with three hemoclips.  The bleeding from the cold snare site was minimal.  In the descending colon a small 3 mm sessile polyp was removed with a cold snare.  In the sigmoid colon a large 2.5-3 cm pedunculated polyp was found.  Fortunately it had a very long stalk.  Using a pure coag current the distal portion of the stalk was resected.  No bleeding occurred, but with the large stalk two hemoclips were placed across the remant stalk to prophylax against any bleeding.     Retroflexed views revealed no abnormalities.     The scope was then withdrawn from the patient and the procedure completed.  WITHDRAWAL TIME: 36 minutes  COMPLICATIONS: There were no immediate complications. ENDOSCOPIC IMPRESSION: 1) Colonic polyps.  The large sigmoid polyp was most likely the source of the anemia and heme positive  stool.  RECOMMENDATIONS: 1) Await biosy results. 2) Repeat the colonoscopy in one year. 3) Stop Effient if there is profuse bleeding. 4) Follow up in the office in one month to assess HGB.  REPEAT EXAM:  cc:  _______________________________ eSignedCarol Ada, MD 2015-01-24 3:16 PM   CPT CODES: ICD CODES:  The ICD and CPT codes recommended by this software are interpretations from the data that the clinical staff has captured with the software.  The verification of the translation of this report to the ICD and CPT codes and modifiers is the sole responsibility of the health care institution and practicing physician where this report was generated.  [employer]. will not be held responsible for the validity of the ICD and CPT codes included on this report.  AMA assumes no liability for data contained or not contained herein. CPT is a Designer, television/film set of the [employer].   PATIENT NAME:  Antonio, Lawrence MR#: [id number]

## 2015-01-13 NOTE — Op Note (Signed)
[facility] [place], [address]   ENDOSCOPY PROCEDURE REPORT  PATIENT: Antonio Lawrence, Antonio Lawrence  MR#: [id number] BIRTHDATE: 29-Jan-1946 , 68  yrs. old GENDER: male ENDOSCOPIST:Yaminah Clayborn Benson Norway, MD REFERRED BY: PROCEDURE DATE:  Jan 15, 2015 PROCEDURE:   Enteroscopy with APC ASA CLASS:    Class III INDICATIONS: Heme positive stool and IDA MEDICATION: Monitored anesthesia care TOPICAL ANESTHETIC:   Cetacaine Spray  DESCRIPTION OF PROCEDURE:   After the risks and benefits of the procedure were explained, informed consent was obtained.  The endoscope was introduced through the mouth  and advanced to the proximal jejunum.  The instrument was slowly withdrawn as the mucosa was fully examined.   FINDINGS: The esophagus was normal and the Z-line was sharp.  In the gastric lumen there was evidence of some minor oozing of blood from a mild gastritis.  Several sites were ablaated with APC.  The duodenal bulb exhibited a mild duodenitis.  The rest of the duodenum and the first portion of the jejunum were normal. Retroflexed views revealed no abnormalities.    The scope was then withdrawn from the patient and the procedure completed.  COMPLICATIONS: There were no immediate complications.  ENDOSCOPIC IMPRESSION: 1) Mild hemorrhagic gastritis s/p APC.  RECOMMENDATIONS: 1) Proceed with the colonoscopy. _______________________________ eSignedCarol Ada, MD January 15, 2015 3:07 PM     cc:  CPT CODES: ICD CODES:  The ICD and CPT codes recommended by this software are interpretations from the data that the clinical staff has captured with the software.  The verification of the translation of this report to the ICD and CPT codes and modifiers is the sole responsibility of the health care institution and practicing physician where this report was generated.  [place]. will not be held responsible for the validity of the ICD and CPT codes included  on this report.  AMA assumes no liability for data contained or not contained herein. CPT is a Designer, television/film set of the [employer].

## 2015-01-13 NOTE — Discharge Instructions (Signed)
Esophagogastroduodenoscopy Care After Refer to this sheet in the next few weeks. These instructions provide you with information on caring for yourself after your procedure. Your caregiver may also give you more specific instructions. Your treatment has been planned according to current medical practices, but problems sometimes occur. Call your caregiver if you have any problems or questions after your procedure.  HOME CARE INSTRUCTIONS  Do not eat or drink anything until the numbing medicine (local anesthetic) has worn off and your gag reflex has returned. You will know that the local anesthetic has worn off when you can swallow comfortably.  Do not drive for 12 hours after the procedure or as directed by your caregiver.  Only take medicines as directed by your caregiver. SEEK MEDICAL CARE IF:   You cannot stop coughing.  You are not urinating at all or less than usual. SEEK IMMEDIATE MEDICAL CARE IF:  You have difficulty swallowing.  You cannot eat or drink.  You have worsening throat or chest pain.  You have dizziness, lightheadedness, or you faint.  You have nausea or vomiting.  You have chills.  You have a fever.  You have severe abdominal pain.  You have black, tarry, or bloody stools. Document Released: 10/14/2012 Document Reviewed: 10/14/2012 [facility] Patient Information 2015 [place]. This information is not intended to replace advice given to you by your health care provider. Make sure you discuss any questions you have with your health care provider.   Colonoscopy, Care After These instructions give you information on caring for yourself after your procedure. Your doctor may also give you more specific instructions. Call your doctor if you have any problems or questions after your procedure. HOME CARE  Do not drive for 24 hours.  Do not sign important papers or use machinery for 24 hours.  You may shower.  You may go back to your usual activities, but  go slower for the first 24 hours.  Take rest breaks often during the first 24 hours.  Walk around or use warm packs on your belly (abdomen) if you have belly cramping or gas.  Drink enough fluids to keep your pee (urine) clear or pale yellow.  Resume your normal diet. Avoid heavy or fried foods.  Avoid drinking alcohol for 24 hours or as told by your doctor.  Only take medicines as told by your doctor. If a tissue sample (biopsy) was taken during the procedure:   Do not take aspirin or blood thinners for 7 days, or as told by your doctor.  Do not drink alcohol for 7 days, or as told by your doctor.  Eat soft foods for the first 24 hours. GET HELP IF: You still have a small amount of blood in your poop (stool) 2-3 days after the procedure. GET HELP RIGHT AWAY IF:  You have more than a small amount of blood in your poop.  You see clumps of tissue (blood clots) in your poop.  Your belly is puffy (swollen).  You feel sick to your stomach (nauseous) or throw up (vomit).  You have a fever.  You have belly pain that gets worse and medicine does not help. MAKE SURE YOU:  Understand these instructions.  Will watch your condition.  Will get help right away if you are not doing well or get worse. Document Released: 11/30/2010 Document Revised: 11/02/2013 Document Reviewed: 07/05/2013 [facility] Patient Information 2015 [place], [place]. This information is not intended to replace advice given to you by your health care provider. Make  sure you discuss any questions you have with your health care provider.

## 2015-01-13 NOTE — Anesthesia Postprocedure Evaluation (Signed)
  Anesthesia Post-op Note  Patient: Antonio Lawrence  Procedure(s) Performed: Procedure(s) (LRB): COLONOSCOPY WITH PROPOFOL (N/A) ENTEROSCOPY with propofol (N/A)  Patient Location: PACU  Anesthesia Type: MAC  Level of Consciousness: awake and alert   Airway and Oxygen Therapy: Patient Spontanous Breathing  Post-op Pain: mild  Post-op Assessment: Post-op Vital signs reviewed, Patient's Cardiovascular Status Stable, Respiratory Function Stable, Patent Airway and No signs of Nausea or vomiting  Last Vitals:  Filed Vitals:   01/13/15 1515  BP: 131/58  Pulse: 65  Temp:   Resp: 13    Post-op Vital Signs: stable   Complications: No apparent anesthesia complications

## 2015-01-13 NOTE — H&P (Signed)
  Antonio Lawrence HPI: This is a 69 year old male with a PMH of an STEMI in 08/2014 s/p DES identified to have anemia and heme positive stool.  His HGB dropped to 6.7 from the 13 range while on Effient.  He denies any issues with hematochezia, melena, abdominal pain, nausea, vomiting, or weight loss.  No prior colonoscopy.  In 2001 he was treated for lung cancer s/p resection.  No use of any NSAIDs.  He is followed by Dr. Burt Knack for his MI/CAD.  Past Medical History  Diagnosis Date  . Lung cancer   . Coronary artery disease     Chronic occlusion of the RCA with left-to-right collaterals, PCI of the left circumflex/obtuse marginal, moderate nonobstructive disease of the LAD  . Hypertension   . Hyperlipidemia   . Hyperglycemia   . Myocardial infarction     Inferoposterior STEMI October 2015, PCI of the first OM branch with a drug-eluting stent    Past Surgical History  Procedure Laterality Date  . Lobectomy    . Left heart cath Bilateral 09/03/2014    Procedure: LEFT HEART CATH;  Surgeon: Laverda Page, MD;  Location: [facility] CATH LAB;  Service: Cardiovascular;  Laterality: Bilateral;    Family History  Problem Relation Age of Onset  . Heart attack Mother   . Cancer Father     Social History:  reports that he quit smoking about 11 years ago. His smoking use included Cigarettes. He does not have any smokeless tobacco history on file. He reports that he does not drink alcohol or use illicit drugs.  Allergies: No Known Allergies  Medications: Scheduled: Continuous:  No results found for this or any previous visit (from the past 24 hour(s)).   No results found.  ROS:  As stated above in the HPI otherwise negative.  There were no vitals taken for this visit.    PE: Gen: NAD, Alert and Oriented HEENT:  [place]/AT, EOMI Neck: Supple, no LAD Lungs: CTA Bilaterally CV: RRR without M/G/R ABM: Soft, NTND, +BS Ext: No C/C/E  Assessment/Plan: 1) Anemia. 2) Heme positive  stool  Plan: 1) EGD/Colonoscopy.  Taziah Difatta D 01/13/2015, 11:15 AM

## 2015-01-13 NOTE — Anesthesia Preprocedure Evaluation (Signed)
Anesthesia Evaluation  Patient identified by MRN, date of birth, ID band Patient awake    Reviewed: Allergy & Precautions, NPO status , Patient's Chart, lab work & pertinent test results  Airway Mallampati: II  TM Distance: >3 FB Neck ROM: Full    Dental no notable dental hx.    Pulmonary former smoker,  2001 lobectomy for lung CA breath sounds clear to auscultation  Pulmonary exam normal       Cardiovascular hypertension, Pt. on medications + CAD, + Past MI and + Cardiac Stents Rhythm:Regular Rate:Normal     Neuro/Psych negative neurological ROS  negative psych ROS   GI/Hepatic negative GI ROS, Neg liver ROS,   Endo/Other  negative endocrine ROS  Renal/GU negative Renal ROS  negative genitourinary   Musculoskeletal negative musculoskeletal ROS (+)   Abdominal   Peds negative pediatric ROS (+)  Hematology negative hematology ROS (+)   Anesthesia Other Findings   Reproductive/Obstetrics negative OB ROS                             Anesthesia Physical Anesthesia Plan  ASA: III  Anesthesia Plan: MAC   Post-op Pain Management:    Induction:   Airway Management Planned: Natural Airway  Additional Equipment:   Intra-op Plan:   Post-operative Plan:   Informed Consent: I have reviewed the patients History and Physical, chart, labs and discussed the procedure including the risks, benefits and alternatives for the proposed anesthesia with the patient or authorized representative who has indicated his/her understanding and acceptance.   Dental advisory given  Plan Discussed with: CRNA  Anesthesia Plan Comments:         Anesthesia Quick Evaluation

## 2015-01-13 NOTE — Transfer of Care (Signed)
Immediate Anesthesia Transfer of Care Note  Patient: Antonio Lawrence  Procedure(s) Performed: Procedure(s): COLONOSCOPY WITH PROPOFOL (N/A) ENTEROSCOPY with propofol (N/A)  Patient Location: PACU and Endoscopy Unit  Anesthesia Type:MAC  Level of Consciousness: awake, sedated and responds to stimulation  Airway & Oxygen Therapy: Patient Spontanous Breathing and Patient connected to nasal cannula oxygen  Post-op Assessment: Report given to RN and Post -op Vital signs reviewed and stable  Post vital signs: Reviewed and stable  Last Vitals:  Filed Vitals:   01/13/15 1214  BP: 176/69  Pulse: 67  Temp: 36.9 C  Resp: 12    Complications: No apparent anesthesia complications

## 2015-01-16 ENCOUNTER — Encounter (HOSPITAL_COMMUNITY): Payer: Self-pay | Admitting: Gastroenterology

## 2015-01-17 ENCOUNTER — Telehealth: Payer: Self-pay | Admitting: Cardiovascular Disease

## 2015-01-17 NOTE — Telephone Encounter (Signed)
I spoke with Diane and the pt's monthly co-pay for Effient is $235 and the pt cannot afford this medication. The pt currently has samples of this medication to last until his appointment in April.  Diane would like to know if the pt's Effient can be switched to a cheaper medication. I will forward this message to Dr Burt Knack to review and make further recommendations.

## 2015-01-17 NOTE — Telephone Encounter (Signed)
Would switch to plavix 75 mg daily

## 2015-01-17 NOTE — Telephone Encounter (Signed)
New Message    Patients wife would like a call regarding patients medication. Please give a call back.  Thanks

## 2015-01-18 NOTE — Telephone Encounter (Signed)
I spoke with Antonio Lawrence and we will plan to switch the pt to plavix during his April office visit.

## 2015-01-19 ENCOUNTER — Other Ambulatory Visit: Payer: Self-pay

## 2015-01-19 MED ORDER — CLOPIDOGREL BISULFATE 75 MG PO TABS: 75.0000 mg | ORAL_TABLET | Freq: Every day | ORAL | Status: DC

## 2015-01-20 ENCOUNTER — Other Ambulatory Visit (HOSPITAL_COMMUNITY): Payer: Self-pay | Admitting: *Deleted

## 2015-01-23 ENCOUNTER — Encounter (HOSPITAL_COMMUNITY)
Admission: RE | Admit: 2015-01-23 | Discharge: 2015-01-23 | Disposition: A | Discharge status: Home / Self Care | Payer: Medicare HMO | Source: Ambulatory Visit | Attending: Gastroenterology | Admitting: Gastroenterology

## 2015-01-23 DIAGNOSIS — D649 Anemia, unspecified: Secondary | ICD-10-CM | POA: Diagnosis present

## 2015-01-23 LAB — PREPARE RBC (CROSSMATCH)

## 2015-01-23 MED ORDER — SODIUM CHLORIDE 0.9 % IV SOLN: Freq: Once | INTRAVENOUS | Status: DC

## 2015-01-24 LAB — TYPE AND SCREEN
ABO/RH(D): B POS
Antibody Screen: NEGATIVE
Unit division: 0
Unit division: 0

## 2015-01-30 ENCOUNTER — Telehealth: Payer: Self-pay | Admitting: Cardiovascular Disease

## 2015-01-30 NOTE — Telephone Encounter (Signed)
Left message on machine for Diane to contact the office.

## 2015-01-30 NOTE — Telephone Encounter (Signed)
I spoke with Diane and the pt did transition from Effient to Plavix last week.  The pt required a second blood transfusion last week and is scheduled to follow-up with Dr Benson Norway later this week.

## 2015-01-30 NOTE — Telephone Encounter (Signed)
Follow Up        Pt's wife returning Lauren's phone call.

## 2015-02-06 ENCOUNTER — Encounter: Payer: Self-pay | Admitting: Cardiovascular Disease

## 2015-02-06 NOTE — Telephone Encounter (Signed)
New Message  Pt wife calling to speak w/ Rn about OV w/ Dr. Benson Norway. Please call back and dsicuss.

## 2015-02-06 NOTE — Telephone Encounter (Signed)
This encounter was created in error - please disregard.

## 2015-02-13 ENCOUNTER — Encounter: Payer: Self-pay | Admitting: Cardiovascular Disease

## 2015-03-06 ENCOUNTER — Ambulatory Visit (INDEPENDENT_AMBULATORY_CARE_PROVIDER_SITE_OTHER): Payer: Medicare HMO | Admitting: Cardiovascular Disease

## 2015-03-06 ENCOUNTER — Encounter: Payer: Self-pay | Admitting: Cardiovascular Disease

## 2015-03-06 VITALS — BP 166/80 | HR 75 | Ht 68.0 in | Wt 179.4 lb

## 2015-03-06 DIAGNOSIS — E785 Hyperlipidemia, unspecified: Secondary | ICD-10-CM | POA: Diagnosis not present

## 2015-03-06 DIAGNOSIS — I25118 Atherosclerotic heart disease of native coronary artery with other forms of angina pectoris: Secondary | ICD-10-CM

## 2015-03-06 DIAGNOSIS — I10 Essential (primary) hypertension: Secondary | ICD-10-CM

## 2015-03-06 LAB — CBC
HCT: 31.4 % — ABNORMAL LOW (ref 39.0–52.0)
Hemoglobin: 9.7 g/dL — ABNORMAL LOW (ref 13.0–17.0)
MCHC: 30.8 g/dL (ref 30.0–36.0)
MCV: 75.1 fl — ABNORMAL LOW (ref 78.0–100.0)
Platelets: 433 10*3/uL — ABNORMAL HIGH (ref 150.0–400.0)
RBC: 4.18 Mil/uL — ABNORMAL LOW (ref 4.22–5.81)
RDW: 29.2 % — ABNORMAL HIGH (ref 11.5–15.5)
WBC: 6.4 10*3/uL (ref 4.0–10.5)

## 2015-03-06 MED ORDER — CARVEDILOL 12.5 MG PO TABS: 12.5000 mg | ORAL_TABLET | Freq: Two times a day (BID) | ORAL | Status: DC

## 2015-03-06 NOTE — Patient Instructions (Signed)
Medication Instructions:  Your physician has recommended you make the following change in your medication:  1. INCREASE Carvedilol to 12.'5mg'$  take one by mouth twice a day  Labwork: Your physician recommends that you have lab work today: CBC  Your physician recommends that you return for a FASTING BMP, LIPID and LIVER in 6 MONTHS--nothing to eat or drink after midnight, lab opens at 7:30 AM  Testing/Procedures: No new orders.   Follow-Up: Your physician wants you to follow-up in: 6 MONTHS with Dr Burt Knack.  You will receive a reminder letter in the mail two months in advance. If you don't receive a letter, please call our office to schedule the follow-up appointment.   Any Other Special Instructions Will Be Listed Below (If Applicable).

## 2015-03-06 NOTE — Progress Notes (Signed)
Cardiology Office Note   Date:  03/06/2015   ID:  [name], DOB [birth year], MRN [id number]  PCP:  Woody Seller, MD  Cardiologist:  Sherren Mocha, MD    Chief Complaint  Patient presents with  . Coronary Artery Disease     History of Present Illness: Antonio Lawrence is a 69 y.o. male who presents for follow-up of coronary artery disease.  The patient has coronary artery disease. He presented 09/03/2014 with an inferoposterior STEMI. He was taken emergently to the cardiac catheterization lab by Dr. [name]. He underwent primary PCI of the first obtuse marginal branch of the left circumflex. He was also noted to have occlusion of the right coronary artery. An attempt to reopen this vessel was made. However, even with backup support the occlusion could not be crossed. It was presumed to be a chronic total occlusion. There were left-to-right collaterals noted. The LAD also had moderate diffuse disease with plans for medical therapy noted. The patient had an uncomplicated post MI course. His LVEF was estimated at 50-55% with inferolateral hypokinesis.  The patient had lower GI bleeding on dual antiplatelet therapy with aspirin and Effient. He underwent colonoscopy and polypectomy. He has undergone repeat colonoscopy as well. Apparently his hemoglobin has stabilized. Most recent hemoglobin from April 1 was 7.4. He denies any further bleeding. He states his energy level is improved. He did have some chest discomfort with exertion about 3 or 4 weeks ago, but this has completely resolved. This may have been associated with his anemia. He has no other complaints today and specifically denies recurrence of chest pain or pressure, shortness of breath, orthopnea, PND, or heart palpitations.  Past Medical History  Diagnosis Date  . Lung cancer   . Coronary artery disease     Chronic occlusion of the RCA with left-to-right collaterals, PCI of the left circumflex/obtuse marginal, moderate  nonobstructive disease of the LAD  . Hypertension   . Hyperlipidemia   . Hyperglycemia   . Myocardial infarction     Inferoposterior STEMI October 2015, PCI of the first OM branch with a drug-eluting stent    Past Surgical History  Procedure Laterality Date  . Lobectomy    . Left heart cath Bilateral 09/03/2014    Procedure: LEFT HEART CATH;  Surgeon: Laverda Page, MD;  Location: [facility] CATH LAB;  Service: Cardiovascular;  Laterality: Bilateral;  . Colonoscopy with propofol N/A 01/13/2015    Procedure: COLONOSCOPY WITH PROPOFOL;  Surgeon: Beryle Beams, MD;  Location: [facility] ENDOSCOPY;  Service: Endoscopy;  Laterality: N/A;  . Enteroscopy N/A 01/13/2015    Procedure: ENTEROSCOPY with propofol;  Surgeon: Beryle Beams, MD;  Location: [facility] ENDOSCOPY;  Service: Endoscopy;  Laterality: N/A;    Current Outpatient Prescriptions  Medication Sig Dispense Refill  . aspirin EC 81 MG EC tablet Take 1 tablet (81 mg total) by mouth daily.    Marland Kitchen atorvastatin (LIPITOR) 80 MG tablet Take 1 tablet (80 mg total) by mouth daily at 6 PM. 30 tablet 6  . carvedilol (COREG) 12.5 MG tablet Take 1 tablet (12.5 mg total) by mouth 2 (two) times daily with a meal. 180 tablet 3  . clopidogrel (PLAVIX) 75 MG tablet Take 1 tablet (75 mg total) by mouth daily. 30 tablet 11  . lisinopril (PRINIVIL,ZESTRIL) 10 MG tablet Take 1 tablet (10 mg total) by mouth daily. 30 tablet 1  . nitroGLYCERIN (NITROSTAT) 0.4 MG SL tablet Place 1 tablet (0.4 mg total) under the tongue every  5 (five) minutes x 3 doses as needed for chest pain. 30 tablet 4   No current facility-administered medications for this visit.    Allergies:   Review of patient's allergies indicates no known allergies.   Social History:  The patient  reports that he quit smoking about 11 years ago. His smoking use included Cigarettes. He does not have any smokeless tobacco history on file. He reports that he does not drink alcohol or use illicit drugs.   Family History:   The patient's family history includes Cancer in his father; Heart attack in his mother.    ROS:  Please see the history of present illness.   All other systems are reviewed and negative.    PHYSICAL EXAM: VS:  BP 166/80 mmHg  Pulse 75  Ht '5\' 8"'$  (1.727 m)  Wt 179 lb 6.4 oz (81.375 kg)  BMI 27.28 kg/m2 , BMI Body mass index is 27.28 kg/(m^2). GEN: Well nourished, well developed, in no acute distress HEENT: normal Neck: no JVD, no masses. No carotid bruits Cardiac: RRR without murmur or gallop                Respiratory:  clear to auscultation bilaterally, normal work of breathing GI: soft, nontender, nondistended, + BS MS: no deformity or atrophy Ext: Trace bilateral pretibial edema, pedal pulses 2+= bilaterally Skin: warm and dry, no rash Neuro:  Strength and sensation are intact Psych: euthymic mood, full affect  EKG:  EKG is ordered today. The ekg ordered today shows normal sinus rhythm 75 bpm, within normal limits.  Recent Labs: 09/03/2014: Magnesium 2.0 09/04/2014: TSH 2.890 09/05/2014: BUN 15; Creatinine 1.06; Potassium 4.6; Sodium 141 11/25/2014: ALT 20 03/06/2015: Hemoglobin 9.7*; Platelets 433.0*   Lipid Panel     Component Value Date/Time   CHOL 96 11/25/2014 0941   TRIG 79.0 11/25/2014 0941   HDL 32.90* 11/25/2014 0941   CHOLHDL 3 11/25/2014 0941   VLDL 15.8 11/25/2014 0941   LDLCALC 47 11/25/2014 0941     Wt Readings from Last 3 Encounters:  03/06/15 179 lb 6.4 oz (81.375 kg)  01/23/15 185 lb (83.915 kg)  01/13/15 185 lb (83.915 kg)     Cardiac Studies Reviewed: 2D Echo: Study Conclusions  - Left ventricle: The cavity size was normal. Wall thickness was increased in a pattern of mild LVH. Systolic function was normal. The estimated ejection fraction was in the range of 55% to 60%. Wall motion was normal; there were no regional wall motion abnormalities. Doppler parameters are consistent with abnormal left ventricular relaxation (grade 1  diastolic dysfunction). The E/e&' ratio is between 8-15, suggesting indeterminate LV filling pressure. - Aortic valve: Poorly visualized. There was no stenosis. There was no regurgitation. - Aorta: Aortic root dimension: 40 mm (ED). Ascending aortic diameter: 42 mm (S). - Ascending aorta: The ascending aorta was mildly dilated. - Mitral valve: Mildly thickened leaflets . There was mild regurgitation. - Left atrium: The atrium was mildly dilated. - Right ventricle: The cavity size was mildly dilated. Wall thickness was normal. Systolic function was normal. - Right atrium: The atrium was mildly dilated. - Tricuspid valve: There was mild regurgitation. - Pulmonary arteries: PA peak pressure: 33 mm Hg (S). - Inferior vena cava: The vessel was normal in size. The respirophasic diameter changes were in the normal range (>= 50%), consistent with normal central venous pressure.  Impressions:  - LVEF 55-60%, mild LVH, no clear wall motion abnormalities, diastolic dysfunction, indeterminate filling pressure, dilated aorta up to  4.2 cm, mild MR, mild TR, RVSP 33 mmHg, normal IVC.  ASSESSMENT AND PLAN: 1.  CAD, native vessel, without angina at present. Will increase his carvedilol to 12.5 mg twice daily in the setting of suboptimal blood pressure control. Otherwise continue same medications. Will consider discontinuation of clopidogrel in 6 months or sooner if he has recurrent bleeding.  2. Essential hypertension, uncontrolled. Most of his blood pressure readings have been greater than 140. Will increase carvedilol to 12.5 mg twice daily. Continue lisinopril.  3. GI bleeding/anemia: He requested repeat CBC today. Will check this to make sure that things are stable.  4. Hyperlipidemia: The patient continues on atorvastatin. His most recent lipids were reviewed. He will follow-up with lab work before his next office visit in 6 months.   Current medicines are reviewed with  the patient today.  The patient does not have concerns regarding medicines.  Labs/ tests ordered today include:   Orders Placed This Encounter  Procedures  . CBC  . Basic Metabolic Panel (BMET)  . Hepatic function panel  . Lipid panel  . EKG 12-Lead    Disposition:   FU 6 months  Signed, Sherren Mocha, MD  03/06/2015 6:17 PM    [facility] [address], [place], [place]  [address] Phone: [phone]; Fax: [phone]

## 2015-03-24 ENCOUNTER — Encounter: Payer: Self-pay | Admitting: Cardiovascular Disease

## 2015-03-24 NOTE — Telephone Encounter (Signed)
Called and left message for daughter to call back. Unable to find an order from Dr. Burt Knack for Carotid Doppler in October. Will also forward message to Dr. Antionette Char nurse for review and follow up.

## 2015-03-24 NOTE — Telephone Encounter (Signed)
This is an error. Please disregard.

## 2015-03-24 NOTE — Telephone Encounter (Signed)
Patient's daughter called asking to schedule carotid doppler In October for her father in the [place] location. Would you enter order and let us know when completed so we can scheduled. Thank you

## 2015-04-13 ENCOUNTER — Telehealth: Payer: Self-pay | Admitting: *Deleted

## 2015-04-14 MED ORDER — ATORVASTATIN CALCIUM 80 MG PO TABS: 80.0000 mg | ORAL_TABLET | Freq: Every day | ORAL | Status: DC

## 2015-04-14 NOTE — Telephone Encounter (Signed)
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  He has transferred his care from Dr [name]. Rx sent to pharmacy.

## 2015-08-20 NOTE — Progress Notes (Signed)
Cardiology Office Note Date:  08/21/2015   ID:  Antonio Lawrence, DOB [birth year], MRN [id number]  PCP:  Woody Seller, MD  Cardiologist:  Sherren Mocha, MD    Chief Complaint  Patient presents with  . Follow-up    CAD   History of Present Illness: Antonio Lawrence is a 69 y.o. male who presents for follow-up of CAD. He presented 09/03/2014 with an inferoposterior STEMI. He was taken emergently to the cardiac catheterization lab by Dr. [name]. He underwent primary PCI of the first obtuse marginal branch of the left circumflex. He was also noted to have occlusion of the right coronary artery. An attempt to reopen this vessel was made. However, even with backup support the occlusion could not be crossed. It was presumed to be a chronic total occlusion. There were left-to-right collaterals noted. The LAD also had moderate diffuse disease with plans for medical therapy noted. The patient had an uncomplicated post MI course. His LVEF was estimated at 50-55% with inferolateral hypokinesis.  The patient had lower GI bleeding on dual antiplatelet therapy with aspirin and Effient. He underwent colonoscopy and polypectomy. He has undergone repeat colonoscopy as well.  The patient is doing well. States his last hemoglobin was in the range of 10 mg/dL. He feels much better. He denies chest pain, chest pressure, shortness of breath, or leg swelling. He has no complaints.  Past Medical History  Diagnosis Date  . Lung cancer ([place])   . Coronary artery disease     Chronic occlusion of the RCA with left-to-right collaterals, PCI of the left circumflex/obtuse marginal, moderate nonobstructive disease of the LAD  . Hypertension   . Hyperlipidemia   . Hyperglycemia   . Myocardial infarction [facility])     Inferoposterior STEMI October 2015, PCI of the first OM branch with a drug-eluting stent    Past Surgical History  Procedure Laterality Date  . Lobectomy    . Left heart cath Bilateral 09/03/2014    Procedure: LEFT HEART CATH;  Surgeon: Laverda Page, MD;  Location: [facility] CATH LAB;  Service: Cardiovascular;  Laterality: Bilateral;  . Colonoscopy with propofol N/A 01/13/2015    Procedure: COLONOSCOPY WITH PROPOFOL;  Surgeon: Antonio Beams, MD;  Location: [facility] ENDOSCOPY;  Service: Endoscopy;  Laterality: N/A;  . Enteroscopy N/A 01/13/2015    Procedure: ENTEROSCOPY with propofol;  Surgeon: Antonio Beams, MD;  Location: [facility] ENDOSCOPY;  Service: Endoscopy;  Laterality: N/A;    Current Outpatient Prescriptions  Medication Sig Dispense Refill  . aspirin EC 81 MG EC tablet Take 1 tablet (81 mg total) by mouth daily.    Marland Kitchen atorvastatin (LIPITOR) 80 MG tablet Take 1 tablet (80 mg total) by mouth daily at 6 PM. 30 tablet 11  . carvedilol (COREG) 25 MG tablet Take 1 tablet (25 mg total) by mouth 2 (two) times daily with a meal. 60 tablet 8  . clopidogrel (PLAVIX) 75 MG tablet Take 1 tablet (75 mg total) by mouth daily. 30 tablet 11  . lisinopril (PRINIVIL,ZESTRIL) 20 MG tablet Take 1 tablet (20 mg total) by mouth daily. 30 tablet 8  . nitroGLYCERIN (NITROSTAT) 0.4 MG SL tablet Place 1 tablet (0.4 mg total) under the tongue every 5 (five) minutes x 3 doses as needed for chest pain. 30 tablet 4   No current facility-administered medications for this visit.    Allergies:   Review of patient's allergies indicates no known allergies.   Social History:  The patient  reports that he  quit smoking about 11 years ago. His smoking use included Cigarettes. He does not have any smokeless tobacco history on file. He reports that he does not drink alcohol or use illicit drugs.   Family History:  The patient's family history includes Cancer in his father; Heart attack in his mother.    ROS:  Please see the history of present illness.  All other systems are reviewed and negative.    PHYSICAL EXAM: VS:  BP 190/90 mmHg  Pulse 54  Ht _0  (1.727 m)  Wt 176 lb (79.833 kg)  BMI 26.77 kg/m2 , BMI Body mass index is  26.77 kg/(m^2). GEN: Well nourished, well developed, in no acute distress HEENT: normal Neck: no JVD, no masses. No carotid bruits Cardiac: RRR without murmur or gallop                Respiratory:  clear to auscultation bilaterally, normal work of breathing GI: soft, nontender, nondistended, + BS MS: no deformity or atrophy Ext: no pretibial edema, pedal pulses 2+= bilaterally Skin: warm and dry, no rash Neuro:  Strength and sensation are intact Psych: euthymic mood, full affect  EKG:  EKG is ordered today. The ekg ordered today showsinus bradycardia 54 bpm, occasional PVC, nonspecific ST abnormality.  Recent Labs: 09/03/2014: Magnesium 2.0 09/04/2014: TSH 2.890 09/05/2014: BUN 15; Creatinine, Ser 1.06; Potassium 4.6; Sodium 141 11/25/2014: ALT 20 03/06/2015: Hemoglobin 9.7*; Platelets 433.0*   Lipid Panel     Component Value Date/Time   CHOL 96 11/25/2014 0941   TRIG 79.0 11/25/2014 0941   HDL 32.90* 11/25/2014 0941   CHOLHDL 3 11/25/2014 0941   VLDL 15.8 11/25/2014 0941   LDLCALC 47 11/25/2014 0941      Wt Readings from Last 3 Encounters:  08/21/15 176 lb (79.833 kg)  03/06/15 179 lb 6.4 oz (81.375 kg)  01/23/15 185 lb (83.915 kg)     Cardiac Studies Reviewed: 2D Echo 11/25/2014: Study Conclusions  - Left ventricle: The cavity size was normal. Wall thickness was increased in a pattern of mild LVH. Systolic function was normal. The estimated ejection fraction was in the range of 55% to 60%. Wall motion was normal; there were no regional wall motion abnormalities. Doppler parameters are consistent with abnormal left ventricular relaxation (grade 1 diastolic dysfunction). The E/e&' ratio is between 8-15, suggesting indeterminate LV filling pressure. - Aortic valve: Poorly visualized. There was no stenosis. There was no regurgitation. - Aorta: Aortic root dimension: 40 mm (ED). Ascending aortic diameter: 42 mm (S). - Ascending aorta: The ascending  aorta was mildly dilated. - Mitral valve: Mildly thickened leaflets . There was mild regurgitation. - Left atrium: The atrium was mildly dilated. - Right ventricle: The cavity size was mildly dilated. Wall thickness was normal. Systolic function was normal. - Right atrium: The atrium was mildly dilated. - Tricuspid valve: There was mild regurgitation. - Pulmonary arteries: PA peak pressure: 33 mm Hg (S). - Inferior vena cava: The vessel was normal in size. The respirophasic diameter changes were in the normal range (>= 50%), consistent with normal central venous pressure.  Impressions:  - LVEF 55-60%, mild LVH, no clear wall motion abnormalities, diastolic dysfunction, indeterminate filling pressure, dilated aorta up to 4.2 cm, mild MR, mild TR, RVSP 33 mmHg, normal IVC.   ASSESSMENT AND PLAN: 1.  CAD, native vessel, without angina: patient stable on aspirin, beta blocker, and statin drug. Will stop plavix as he is out to 12 months from PCI.  2. Essential Hypertension: blood  pressure is uncontrolled. Recommended increase carvedilol to 25 mg twice daily and increase lisinopril to 20 mg daily. Three month follow-up with APP.   3. Hyperlipidemia: Lipids at goal on last check. Repeat labs in 3 months.  4. GI bleeding/anemia: by report this has stabilized. No symptoms of GI bleeding and H/H have been stable.   Current medicines are reviewed with the patient today.  The patient does not have concerns regarding medicines.  Labs/ tests ordered today include:   Orders Placed This Encounter  Procedures  . Lipid panel  . Comp Met (CMET)  . EKG 12-Lead    Disposition:   FU 3 months with APP and 6 months with me  Signed, Sherren Mocha, MD  08/21/2015 9:57 AM    [facility] [place], [place],   [address] Phone: [phone]; Fax: [phone]

## 2015-08-21 ENCOUNTER — Encounter: Payer: Self-pay | Admitting: Cardiovascular Disease

## 2015-08-21 ENCOUNTER — Ambulatory Visit (INDEPENDENT_AMBULATORY_CARE_PROVIDER_SITE_OTHER): Payer: Medicare HMO | Admitting: Cardiovascular Disease

## 2015-08-21 ENCOUNTER — Other Ambulatory Visit (INDEPENDENT_AMBULATORY_CARE_PROVIDER_SITE_OTHER): Payer: Medicare HMO | Admitting: *Deleted

## 2015-08-21 VITALS — BP 190/90 | HR 54 | Ht 68.0 in | Wt 176.0 lb

## 2015-08-21 DIAGNOSIS — E785 Hyperlipidemia, unspecified: Secondary | ICD-10-CM | POA: Diagnosis not present

## 2015-08-21 DIAGNOSIS — I25118 Atherosclerotic heart disease of native coronary artery with other forms of angina pectoris: Secondary | ICD-10-CM

## 2015-08-21 DIAGNOSIS — I10 Essential (primary) hypertension: Secondary | ICD-10-CM | POA: Diagnosis not present

## 2015-08-21 LAB — BASIC METABOLIC PANEL
BUN: 13 mg/dL (ref 7–25)
CO2: 23 mmol/L (ref 20–31)
Calcium: 9.3 mg/dL (ref 8.6–10.3)
Chloride: 106 mmol/L (ref 98–110)
Creat: 0.88 mg/dL (ref 0.70–1.25)
Glucose, Bld: 92 mg/dL (ref 65–99)
Potassium: 4.2 mmol/L (ref 3.5–5.3)
Sodium: 138 mmol/L (ref 135–146)

## 2015-08-21 LAB — HEPATIC FUNCTION PANEL
ALT: 18 U/L (ref 9–46)
AST: 19 U/L (ref 10–35)
Albumin: 3.8 g/dL (ref 3.6–5.1)
Alkaline Phosphatase: 70 U/L (ref 40–115)
Bilirubin, Direct: <0.1 mg/dL (ref <=0.2)
Total Bilirubin: 0.3 mg/dL (ref 0.2–1.2)
Total Protein: 6.9 g/dL (ref 6.1–8.1)

## 2015-08-21 LAB — LIPID PANEL
Cholesterol: 123 mg/dL — ABNORMAL LOW (ref 125–200)
HDL: 38 mg/dL — ABNORMAL LOW (ref >=40)
LDL Cholesterol: 70 mg/dL (ref <130)
Total CHOL/HDL Ratio: 3.2 Ratio (ref <=5.0)
Triglycerides: 77 mg/dL (ref <150)
VLDL: 15 mg/dL (ref <30)

## 2015-08-21 MED ORDER — CARVEDILOL 25 MG PO TABS: 25.0000 mg | ORAL_TABLET | Freq: Two times a day (BID) | ORAL | Status: DC

## 2015-08-21 MED ORDER — LISINOPRIL 20 MG PO TABS: 20.0000 mg | ORAL_TABLET | Freq: Every day | ORAL | Status: DC

## 2015-08-21 NOTE — Patient Instructions (Addendum)
Medication Instructions:  Your physician has recommended you make the following change in your medication:  1. STOP Plavix when you finish your current supply 2. INCREASE Carvedilol to '25mg'$  take one by mouth twice a day 3. INCREASE Lisinopril to '20mg'$  take one by mouth daily  Labwork: Your physician recommends that you return for a FASTING LIPID and CMP in 3 MONTHS--nothing to eat or drink after midnight, lab opens at 7:30 AM  Testing/Procedures: No new orders.   Follow-Up: Your physician recommends that you schedule a follow-up appointment in: 3 MONTHS with PA/NP  Your physician wants you to follow-up in: 6 MONTHS with Dr Burt Knack.  You will receive a reminder letter in the mail two months in advance. If you don't receive a letter, please call our office to schedule the follow-up appointment.   Any Other Special Instructions Will Be Listed Below (If Applicable).

## 2015-08-21 NOTE — Addendum Note (Signed)
Addended by: Eulis Foster on: 08/21/2015 08:47 AM   Modules accepted: Orders

## 2015-10-18 ENCOUNTER — Telehealth: Payer: Self-pay | Admitting: Cardiovascular Disease

## 2015-10-18 DIAGNOSIS — I2510 Atherosclerotic heart disease of native coronary artery without angina pectoris: Secondary | ICD-10-CM

## 2015-10-18 NOTE — Telephone Encounter (Signed)
Left message on machine for Antonio Lawrence to contact the office.   

## 2015-10-18 NOTE — Telephone Encounter (Signed)
New MEssasge  Pt requested to speak w/ RN- did not specify the nature of call- Please call back and discuss.

## 2015-10-18 NOTE — Telephone Encounter (Signed)
F/u  Pt returning RN phone call

## 2015-10-18 NOTE — Telephone Encounter (Signed)
I spoke with Diane and she called to clarify the pt's next appointment.  She also asked if the pt's CBC could be checked to follow-up on his hemoglobin.  I added this order to his lab appointment.

## 2015-11-30 NOTE — Progress Notes (Signed)
Cardiology Office Note:    Date:  12/01/2015   ID:  Antonio Lawrence, DOB 1946/08/16, MRN [id number]  PCP:  Woody Seller, MD  Cardiologist:  Dr. Sherren Mocha   Electrophysiologist:  n/a  Chief Complaint  Patient presents with  . Coronary Artery Disease    Follow Up    History of Present Illness:    Antonio Lawrence is a 70 y.o. male with a hx of CAD, lung CA, HTN, HL. He suffered an inferoposterior STEMI in 10/15 treated with primary PCI of the OM1. RCA was occluded. Attempt to open RCA was unsuccessful (presumed to be chronic total occlusion with L-R collaterals). EF has remained preserved. Other history includes lower GI bleeding on dual at the platelet therapy. He has undergone colonoscopy with polypectomy.  Last seen by Dr. Burt Knack 10/16.  He was taken off Plavix as he was 12 months out from his PCI. Blood pressure was uncontrolled and his medications were adjusted. He returns for follow-up.   He is doing well.  The patient denies any chest pain, significant dyspnea, syncope, orthopnea, PND, edema.   Past Medical History  Diagnosis Date  . Lung cancer ([place])   . Coronary artery disease     Chronic occlusion of the RCA with left-to-right collaterals, PCI of the left circumflex/obtuse marginal, moderate nonobstructive disease of the LAD  . Hypertension   . Hyperlipidemia   . Hyperglycemia   . Myocardial infarction [facility])     Inferoposterior STEMI October 2015, PCI of the first OM branch with a drug-eluting stent    Past Surgical History  Procedure Laterality Date  . Lobectomy    . Left heart cath Bilateral 09/03/2014    Procedure: LEFT HEART CATH;  Surgeon: Laverda Page, MD;  Location: [facility] CATH LAB;  Service: Cardiovascular;  Laterality: Bilateral;  . Colonoscopy with propofol N/A 01/13/2015    Procedure: COLONOSCOPY WITH PROPOFOL;  Surgeon: Beryle Beams, MD;  Location: [facility] ENDOSCOPY;  Service: Endoscopy;  Laterality: N/A;  . Enteroscopy N/A 01/13/2015   Procedure: ENTEROSCOPY with propofol;  Surgeon: Beryle Beams, MD;  Location: [facility] ENDOSCOPY;  Service: Endoscopy;  Laterality: N/A;    Current Medications: Outpatient Prescriptions Prior to Visit  Medication Sig Dispense Refill  . aspirin EC 81 MG EC tablet Take 1 tablet (81 mg total) by mouth daily.    Marland Kitchen atorvastatin (LIPITOR) 80 MG tablet Take 1 tablet (80 mg total) by mouth daily at 6 PM. 30 tablet 11  . carvedilol (COREG) 25 MG tablet Take 1 tablet (25 mg total) by mouth 2 (two) times daily with a meal. 60 tablet 8  . nitroGLYCERIN (NITROSTAT) 0.4 MG SL tablet Place 1 tablet (0.4 mg total) under the tongue every 5 (five) minutes x 3 doses as needed for chest pain. 30 tablet 4  . lisinopril (PRINIVIL,ZESTRIL) 20 MG tablet Take 1 tablet (20 mg total) by mouth daily. 30 tablet 8  . clopidogrel (PLAVIX) 75 MG tablet Take 1 tablet (75 mg total) by mouth daily. (Patient not taking: Reported on 12/01/2015) 30 tablet 11   No facility-administered medications prior to visit.     Allergies:   Review of patient's allergies indicates no known allergies.   Social History   Social History  . Marital Status: Married    Spouse Name: N/A  . Number of Children: N/A  . Years of Education: N/A   Social History Main Topics  . Smoking status: Former Smoker    Types: Cigarettes  Quit date: 12/26/2003  . Smokeless tobacco: None  . Alcohol Use: No  . Drug Use: No  . Sexual Activity: Yes   Other Topics Concern  . None   Social History Narrative   The patient is married. He is a former smoker but quit in 2001 when he was diagnosed with lung cancer.     Family History:  The patient's family history includes Cancer in his father; Heart attack in his mother.   ROS:   Please see the history of present illness.    ROS All other systems reviewed and are negative.   Physical Exam:    VS:  BP 158/80 mmHg  Pulse 52  Ht '5\' 8"'$  (1.727 m)  Wt 181 lb (82.101 kg)  BMI 27.53 kg/m2   GEN: Well  nourished, well developed, in no acute distress HEENT: normal Neck: no JVD, no masses Cardiac: Normal S1/S2, RRR; no murmurs   Respiratory:  clear to auscultation bilaterally; no wheezing, rhonchi or rales GI: soft, nontender, nondistended MS: no deformity or atrophy Skin: warm and dry, no rash Neuro:  no focal deficits  Psych: Alert and oriented x 3, normal affect  Wt Readings from Last 3 Encounters:  12/01/15 181 lb (82.101 kg)  08/21/15 176 lb (79.833 kg)  03/06/15 179 lb 6.4 oz (81.375 kg)      Studies/Labs Reviewed:    EKG:  EKG is  ordered today.  The ekg ordered today demonstrates sinus bradycardia, HR 47, normal axis, nonspecific ST-T wave changes, QTc 396 ms, no change from prior tracing  Recent Labs: 03/06/2015: Hemoglobin 9.7*; Platelets 433.0* 08/21/2015: ALT 18; BUN 13; Creat 0.88; Potassium 4.2; Sodium 138   Recent Lipid Panel    Component Value Date/Time   CHOL 123* 08/21/2015 0847   TRIG 77 08/21/2015 0847   HDL 38* 08/21/2015 0847   CHOLHDL 3.2 08/21/2015 0847   VLDL 15 08/21/2015 0847   LDLCALC 70 08/21/2015 0847    Additional studies/ records that were reviewed today include:   Echo 11/25/14 Mild LVH, EF 55-60%, normal wall motion, grade 1 diastolic dysfunction, mildly dilated ascending aorta at 42 mm, aortic root 40 mm, mild LAE, normal RVSF, mild RAE, mild TR, PASP 33 mmHg  LHC 09/03/14 LVEF of 50-55% inferior lateral wall moderate hypokinesis. RCA mid occluded, L-R collaterals LM 20-30% LCx with OM1 proximal 99% LAD mid intramyocardial bridging, mid 40%,distal 70-80% PCI:  Successful PTCA and stenting of the Proximal OM1 2.5 x 18 mm with Xience Alpine DES.    ASSESSMENT:    1. Essential hypertension   2. Coronary artery disease involving native coronary artery of native heart without angina pectoris   3. Hyperlipidemia   4. History of GI bleed   5. Bradycardia     PLAN:    In order of problems listed above:  1. HTN - BP still not  controlled.  I will change his Lisinopril to Lisinopril/HCTZ 20/12.5 mg QD.  Check BMET in 1 week.  I have asked him to check his BP a few times a week and call me with his readings in a few weeks.  Consider increasing ACE/thiazide combo vs adding Amlodipine if remains too high.  2. CAD - No angina.  Continue ASA, statin, beta-blocker.   3. HL -  Continue statin.  LDL in 10/16 was 70.    4. Hx of GI bleed - No apparent recurrence.   5. Bradycardia - Asymptomatic.      Medication Adjustments/Labs and Tests Ordered:  Current medicines are reviewed at length with the patient today.  Concerns regarding medicines are outlined above.  Medication changes, Labs and Tests ordered today are outlined in the Patient Instructions noted below. Patient Instructions  Medication Instructions:  1. STOP LISINOPRIL  2. START LISINOPRIL/HCTZ 20/12.5 MG DAILY  Labwork: 12/08/15 YOU WILL NEED A BMET  Testing/Procedures: NONE  Follow-Up: 03/04/16 @ 9 AM WITH DR. Burt Knack   Any Other Special Instructions Will Be Listed Below (If Applicable).  CHECK BP FEW TIMES PER WEEK FOR THE NEXT 2 WEEKS AFTER STARTING ON THE LISINOPRIL/HCTZ; CALL IN 2 WEEKS WITH READINGS TO [place], [place] [phone]   If you need a refill on your cardiac medications before your next appointment, please call your pharmacy.       Signed, Richardson Dopp, PA-C  12/01/2015 9:45 AM    [facility] [place], [place], [place]  [address] Phone: [phone]; Fax: [phone]

## 2015-12-01 ENCOUNTER — Ambulatory Visit (INDEPENDENT_AMBULATORY_CARE_PROVIDER_SITE_OTHER): Payer: Medicare HMO | Admitting: Physician Assistant

## 2015-12-01 ENCOUNTER — Encounter: Payer: Self-pay | Admitting: Physician Assistant

## 2015-12-01 ENCOUNTER — Other Ambulatory Visit (INDEPENDENT_AMBULATORY_CARE_PROVIDER_SITE_OTHER): Payer: Medicare HMO | Admitting: *Deleted

## 2015-12-01 VITALS — BP 158/80 | HR 52 | Ht 68.0 in | Wt 181.0 lb

## 2015-12-01 DIAGNOSIS — E785 Hyperlipidemia, unspecified: Secondary | ICD-10-CM

## 2015-12-01 DIAGNOSIS — I2510 Atherosclerotic heart disease of native coronary artery without angina pectoris: Secondary | ICD-10-CM

## 2015-12-01 DIAGNOSIS — I10 Essential (primary) hypertension: Secondary | ICD-10-CM

## 2015-12-01 DIAGNOSIS — Z8719 Personal history of other diseases of the digestive system: Secondary | ICD-10-CM | POA: Diagnosis not present

## 2015-12-01 DIAGNOSIS — I25118 Atherosclerotic heart disease of native coronary artery with other forms of angina pectoris: Secondary | ICD-10-CM

## 2015-12-01 DIAGNOSIS — R001 Bradycardia, unspecified: Secondary | ICD-10-CM

## 2015-12-01 LAB — LIPID PANEL
Cholesterol: 136 mg/dL (ref 125–200)
HDL: 41 mg/dL (ref >=40)
LDL Cholesterol: 72 mg/dL (ref <130)
Total CHOL/HDL Ratio: 3.3 Ratio (ref <=5.0)
Triglycerides: 113 mg/dL (ref <150)
VLDL: 23 mg/dL (ref <30)

## 2015-12-01 LAB — COMPREHENSIVE METABOLIC PANEL
ALT: 20 U/L (ref 9–46)
AST: 17 U/L (ref 10–35)
Albumin: 3.5 g/dL — ABNORMAL LOW (ref 3.6–5.1)
Alkaline Phosphatase: 72 U/L (ref 40–115)
BUN: 15 mg/dL (ref 7–25)
CO2: 27 mmol/L (ref 20–31)
Calcium: 8.7 mg/dL (ref 8.6–10.3)
Chloride: 106 mmol/L (ref 98–110)
Creat: 0.96 mg/dL (ref 0.70–1.25)
Glucose, Bld: 99 mg/dL (ref 65–99)
Potassium: 4.4 mmol/L (ref 3.5–5.3)
Sodium: 139 mmol/L (ref 135–146)
Total Bilirubin: 0.6 mg/dL (ref 0.2–1.2)
Total Protein: 6.7 g/dL (ref 6.1–8.1)

## 2015-12-01 LAB — CBC
HCT: 40.4 % (ref 39.0–52.0)
Hemoglobin: 13.3 g/dL (ref 13.0–17.0)
MCH: 29.5 pg (ref 26.0–34.0)
MCHC: 32.9 g/dL (ref 30.0–36.0)
MCV: 89.6 fL (ref 78.0–100.0)
MPV: 8.9 fL (ref 8.6–12.4)
Platelets: 258 10*3/uL (ref 150–400)
RBC: 4.51 MIL/uL (ref 4.22–5.81)
RDW: 15.1 % (ref 11.5–15.5)
WBC: 6.3 10*3/uL (ref 4.0–10.5)

## 2015-12-01 MED ORDER — LISINOPRIL-HYDROCHLOROTHIAZIDE 20-12.5 MG PO TABS: 1.0000 | ORAL_TABLET | Freq: Every day | ORAL | Status: DC

## 2015-12-01 NOTE — Addendum Note (Signed)
Addended by: Eulis Foster on: 12/01/2015 09:57 AM   Modules accepted: Orders

## 2015-12-01 NOTE — Addendum Note (Signed)
Addended by: Eulis Foster on: 12/01/2015 09:53 AM   Modules accepted: Orders

## 2015-12-01 NOTE — Patient Instructions (Signed)
Medication Instructions:  1. STOP LISINOPRIL  2. START LISINOPRIL/HCTZ 20/12.5 MG DAILY  Labwork: 12/08/15 YOU WILL NEED A BMET  Testing/Procedures: NONE  Follow-Up: 03/04/16 @ 9 AM WITH DR. Burt Knack   Any Other Special Instructions Will Be Listed Below (If Applicable).  CHECK BP FEW TIMES PER WEEK FOR THE NEXT 2 WEEKS AFTER STARTING ON THE LISINOPRIL/HCTZ; CALL IN 2 WEEKS WITH READINGS TO [place], [place] [phone]   If you need a refill on your cardiac medications before your next appointment, please call your pharmacy.

## 2015-12-01 NOTE — Addendum Note (Signed)
Addended by: Michae Kava on: 12/01/2015 09:56 AM   Modules accepted: Orders

## 2015-12-04 ENCOUNTER — Encounter: Payer: Self-pay | Admitting: Cardiovascular Disease

## 2015-12-04 NOTE — Telephone Encounter (Signed)
Left message on machine for Diane to contact the office.   

## 2015-12-04 NOTE — Telephone Encounter (Signed)
New problem   Pt's wife stated she is returning a call from nurse. Please call pt.

## 2015-12-04 NOTE — Telephone Encounter (Signed)
This encounter was created in error - please disregard.

## 2015-12-08 ENCOUNTER — Other Ambulatory Visit (INDEPENDENT_AMBULATORY_CARE_PROVIDER_SITE_OTHER): Payer: Medicare HMO | Admitting: *Deleted

## 2015-12-08 DIAGNOSIS — I10 Essential (primary) hypertension: Secondary | ICD-10-CM

## 2015-12-08 DIAGNOSIS — I2510 Atherosclerotic heart disease of native coronary artery without angina pectoris: Secondary | ICD-10-CM

## 2015-12-08 LAB — BASIC METABOLIC PANEL
BUN: 19 mg/dL (ref 7–25)
CO2: 26 mmol/L (ref 20–31)
Calcium: 9 mg/dL (ref 8.6–10.3)
Chloride: 103 mmol/L (ref 98–110)
Creat: 1.06 mg/dL (ref 0.70–1.25)
Glucose, Bld: 88 mg/dL (ref 65–99)
Potassium: 4.6 mmol/L (ref 3.5–5.3)
Sodium: 139 mmol/L (ref 135–146)

## 2015-12-08 NOTE — Addendum Note (Signed)
Addended by: Eulis Foster on: 12/08/2015 07:38 AM   Modules accepted: Orders

## 2015-12-11 ENCOUNTER — Telehealth: Payer: Self-pay | Admitting: *Deleted

## 2015-12-11 NOTE — Telephone Encounter (Signed)
F/u  Pt returningphone call. Please call back and discuss.   

## 2015-12-11 NOTE — Telephone Encounter (Signed)
Pt's wife Diane notified of lab results by phone with verbal understanding and to continue current medication regimen.

## 2015-12-11 NOTE — Telephone Encounter (Signed)
Follow Up  Pt wife called to follow Up.

## 2015-12-11 NOTE — Telephone Encounter (Signed)
Lmptcb for lab results 

## 2016-01-02 ENCOUNTER — Telehealth: Payer: Self-pay | Admitting: Cardiovascular Disease

## 2016-01-02 NOTE — Telephone Encounter (Signed)
Left message on machine for Diane to contact the office.   

## 2016-01-02 NOTE — Telephone Encounter (Signed)
Mrs. Kuba is calling with the information that Dr. Burt Knack wanted on Mr. Bollig .

## 2016-01-03 NOTE — Telephone Encounter (Signed)
I left a detailed message on Antonio Lawrence's voicemail to let her know that Dr Burt Knack did not recommend any medication changes at this time. The pt should continue his current medication regimen.

## 2016-01-03 NOTE — Telephone Encounter (Signed)
I feel this patient's blood pressure control is within range. Most of his readings after the medication change are in the 130s over 70s. Would continue same regimen.

## 2016-01-03 NOTE — Telephone Encounter (Signed)
I spoke with Diane and she gave me the pt's BP readings:  1/24 149/81  1/26 147/75 1/28 150/73 1/31 124/61 2/1 130/65 2/2 134/74 2/4 135/65 2/7 134/76 2/9 146/74 2/11 124/60 2/14 139/74 2/16 134/69 2/18 125/61  I will forward these readings to Dr Burt Knack to review and make further recommendations if needed.

## 2016-02-20 ENCOUNTER — Telehealth: Payer: Self-pay | Admitting: Cardiovascular Disease

## 2016-02-20 NOTE — Telephone Encounter (Signed)
I received fax from Dr Ulyses Amor office and they are requesting cardiac clearance for pt to have colonoscopy on 02/27/16. Clearance can be faxed to [phone].

## 2016-02-20 NOTE — Telephone Encounter (Signed)
I spoke with Antonio Lawrence and the pt is scheduled for a colonoscopy on 4/18 with Dr Benson Norway.  She is requesting that we fax a clearance to Dr Ulyses Amor office for the pt to have a colonoscopy. I made her aware that Dr Ulyses Amor office will need to fax a formal request to our office for clearance.  Fax number provided.

## 2016-02-20 NOTE — Telephone Encounter (Signed)
New Message  Pt Wife called. Request a call back to discuss the information that Dr. Burt Knack will need to send to Dr. Geoffry Paradise. No further details.

## 2016-02-22 NOTE — Telephone Encounter (Signed)
Chart reviewed. Seen by Richardson Dopp in January. No angina at that time. Only issue was high BP and meds were titrated. He is at low risk of colonoscopy and can proceed as planned.

## 2016-02-22 NOTE — Telephone Encounter (Signed)
Telephone encounter faxed to Dr Ulyses Amor office. Diane aware that pt can proceed with colonoscopy.

## 2016-03-04 ENCOUNTER — Ambulatory Visit (INDEPENDENT_AMBULATORY_CARE_PROVIDER_SITE_OTHER): Payer: Medicare HMO | Admitting: Cardiovascular Disease

## 2016-03-04 ENCOUNTER — Encounter: Payer: Self-pay | Admitting: Cardiovascular Disease

## 2016-03-04 VITALS — BP 104/48 | HR 56 | Ht 68.0 in | Wt 183.1 lb

## 2016-03-04 DIAGNOSIS — I2510 Atherosclerotic heart disease of native coronary artery without angina pectoris: Secondary | ICD-10-CM

## 2016-03-04 DIAGNOSIS — E785 Hyperlipidemia, unspecified: Secondary | ICD-10-CM | POA: Diagnosis not present

## 2016-03-04 DIAGNOSIS — I10 Essential (primary) hypertension: Secondary | ICD-10-CM

## 2016-03-04 NOTE — Progress Notes (Signed)
Cardiology Office Note Date:  03/04/2016   ID:  Antonio Lawrence, DOB 1946-04-11, MRN [id number]  PCP:  Woody Seller, MD  Cardiologist:  Sherren Mocha, MD    Chief Complaint  Patient presents with  . coronary artery disease involving coronary artery of native    without angina pectoris     History of Present Illness: Antonio Lawrence is a 70 y.o. male who presents for Follow-up of coronary artery disease. The patient had an acute inferoposterior STEMI in 2015 treated with primary PCI of the first obtuse marginal branch of the circumflex looks. His right coronary artery was occluded with attempts made to open this vessel. This was unsuccessful and it was presumed to be a chronic total occlusion. The LAD had no high-grade obstruction. The patient's LVEF has been well preserved. The patient had significant lower GI bleeding on dual antiplatelet therapy. He has undergone colonoscopy with polypectomy. He recently underwent a repeat colonoscopy and had treatment for 5 polyps. Repeat colonoscopy is recommended in 3 years. He's had no further GI bleeding. He's been off of Plavix now since last year.  The patient is doing well. He denies recurrence of chest pain, shortness of breath, leg swelling, heart palpitations, lightheadedness, or other cardiac symptoms. He has no complaints today. Home blood pressure has been ranging from 106-133/57-68. Heart rates have ranged from 48-52 on average.   Past Medical History  Diagnosis Date  . Lung cancer ([place])   . Coronary artery disease     Chronic occlusion of the RCA with left-to-right collaterals, PCI of the left circumflex/obtuse marginal, moderate nonobstructive disease of the LAD  . Hypertension   . Hyperlipidemia   . Hyperglycemia   . Myocardial infarction [facility])     Inferoposterior STEMI October 2015, PCI of the first OM branch with a drug-eluting stent    Past Surgical History  Procedure Laterality Date  . Lobectomy    . Left heart  cath Bilateral 09/03/2014    Procedure: LEFT HEART CATH;  Surgeon: Laverda Page, MD;  Location: [facility] CATH LAB;  Service: Cardiovascular;  Laterality: Bilateral;  . Colonoscopy with propofol N/A 01/13/2015    Procedure: COLONOSCOPY WITH PROPOFOL;  Surgeon: Beryle Beams, MD;  Location: [facility] ENDOSCOPY;  Service: Endoscopy;  Laterality: N/A;  . Enteroscopy N/A 01/13/2015    Procedure: ENTEROSCOPY with propofol;  Surgeon: Beryle Beams, MD;  Location: [facility] ENDOSCOPY;  Service: Endoscopy;  Laterality: N/A;    Current Outpatient Prescriptions  Medication Sig Dispense Refill  . aspirin EC 81 MG EC tablet Take 1 tablet (81 mg total) by mouth daily.    Marland Kitchen atorvastatin (LIPITOR) 80 MG tablet Take 1 tablet (80 mg total) by mouth daily at 6 PM. 30 tablet 11  . carvedilol (COREG) 25 MG tablet Take 1 tablet (25 mg total) by mouth 2 (two) times daily with a meal. 60 tablet 8  . lisinopril-hydrochlorothiazide (PRINZIDE,ZESTORETIC) 20-12.5 MG tablet Take 1 tablet by mouth daily. 30 tablet 11  . nitroGLYCERIN (NITROSTAT) 0.4 MG SL tablet Place 1 tablet (0.4 mg total) under the tongue every 5 (five) minutes x 3 doses as needed for chest pain. 30 tablet 4   No current facility-administered medications for this visit.    Allergies:   Review of patient's allergies indicates no known allergies.   Social History:  The patient  reports that he quit smoking about 12 years ago. His smoking use included Cigarettes. He does not have any smokeless tobacco history on file.  He reports that he does not drink alcohol or use illicit drugs.   Family History:  The patient's  family history includes Cancer in his father; Heart attack in his mother.    ROS:  Please see the history of present illness. All other systems are reviewed and negative.    PHYSICAL EXAM: VS:  BP 104/48 mmHg  Pulse 56  Ht '5\' 8"'$  (1.727 m)  Wt 183 lb 1.9 oz (83.063 kg)  BMI 27.85 kg/m2 , BMI Body mass index is 27.85 kg/(m^2). GEN: Well nourished, well  developed, in no acute distress HEENT: normal Neck: no JVD, no masses. No carotid bruits Cardiac: RRR without murmur or gallop                Respiratory:  clear to auscultation bilaterally, normal work of breathing GI: soft, nontender, nondistended, + BS MS: no deformity or atrophy Ext: no pretibial edema, pedal pulses 2+= bilaterally Skin: warm and dry, no rash Neuro:  Strength and sensation are intact Psych: euthymic mood, full affect  EKG:  EKG is not ordered today.  Recent Labs: 12/01/2015: ALT 20; Hemoglobin 13.3; Platelets 258 12/08/2015: BUN 19; Creat 1.06; Potassium 4.6; Sodium 139   Lipid Panel     Component Value Date/Time   CHOL 136 12/01/2015 0957   TRIG 113 12/01/2015 0957   HDL 41 12/01/2015 0957   CHOLHDL 3.3 12/01/2015 0957   VLDL 23 12/01/2015 0957   LDLCALC 72 12/01/2015 0957      Wt Readings from Last 3 Encounters:  03/04/16 183 lb 1.9 oz (83.063 kg)  12/01/15 181 lb (82.101 kg)  08/21/15 176 lb (79.833 kg)     ASSESSMENT AND PLAN: 1.  CAD, native vessel, with old MI: The patient is stable with no symptoms of angina. He will continue on aspirin for antiplatelet therapy, atorvastatin for lipid lowering, and a combination of carvedilol and lisinopril/HCT for hypertension. I will see him back in about 9 months at which time he'll be due for lipids.  2. Hyperlipidemia: Treated with high-dose atorvastatin. Most recent labs reviewed. Will repeat in January. We'll check LFTs at that time.  3. Essential hypertension: Blood pressure is now well controlled on a combination of carvedilol, lisinopril, and hydrochlorothiazide.  4. GI bleeding: recent colonoscopy report reviewed. Overall appears stable. FU colonoscopy planned in 3 years.   Current medicines are reviewed with the patient today.  The patient does not have concerns regarding medicines.  Labs/ tests ordered today include:  No orders of the defined types were placed in this encounter.    Disposition:    FU 9 months with a CMET and lipid panel  Signed, Sherren Mocha, MD  03/04/2016 9:23 AM    [facility] [place], [place], [place]  [address] Phone: [phone]; Fax: [phone]

## 2016-03-04 NOTE — Patient Instructions (Signed)
Medication Instructions:  Your physician recommends that you continue on your current medications as directed. Please refer to the Current Medication list given to you today.  Labwork: Your physician recommends that you return for a FASTING LIPID and CMP in January 2018--nothing to eat or drink after midnight, lab opens at 7:30 AM  Testing/Procedures: No new orders.   Follow-Up: Your physician wants you to follow-up in: January 2018 with Dr Burt Knack.  You will receive a reminder letter in the mail two months in advance. If you don't receive a letter, please call our office to schedule the follow-up appointment.   Any Other Special Instructions Will Be Listed Below (If Applicable).     If you need a refill on your cardiac medications before your next appointment, please call your pharmacy.

## 2016-03-19 ENCOUNTER — Telehealth: Payer: Self-pay | Admitting: Cardiovascular Disease

## 2016-03-19 DIAGNOSIS — I25118 Atherosclerotic heart disease of native coronary artery with other forms of angina pectoris: Secondary | ICD-10-CM

## 2016-03-19 DIAGNOSIS — I10 Essential (primary) hypertension: Secondary | ICD-10-CM

## 2016-03-19 DIAGNOSIS — E785 Hyperlipidemia, unspecified: Secondary | ICD-10-CM

## 2016-03-19 NOTE — Telephone Encounter (Signed)
New Message  Pt wife called request a call back to discuss the fact the pt seen the primary care provider about his SOB. Was advised it was just alllergies. Took and EKG and found a blockage in the arteries. Pt wife states that she believes that they are "making a mountain out of a molehill" but she is requesting a call back.

## 2016-03-19 NOTE — Telephone Encounter (Signed)
I spoke with Diane and made her aware that EKG from PCP shows 1st degree AV block (218 ms), rate 51.  The pt's most recent EKG with our office measured PR interval of 196 ms.  I advised Diane that this is not due to a blockage in a coronary artery but is it related to the electrical activity of the heart. Because of 1st degree AV block the pt does not require an appointment at this time.  I asked Diane about the pt's home BP and pulse readings and the pt has noted some lower SBP readings near 100 and pulse of 48. She said the pt is also tired.  I made Diane aware that I will forward this message to Dr Burt Knack to review and make recommendations in regards to medication change if needed.

## 2016-03-19 NOTE — Telephone Encounter (Signed)
New Message  Pt was seen on 03/15/2016. EKG performed and it showed 1st degree AV block. Advised pt to call Cardiology and inform because it was something different from any other EKG. Pt is taking carvedilol. Please call the office back to discuss if needed. Provider just wanted cardiologist to know that they found this during an acute visit because the pt came in complain of SOB upper raspatory infection. ECG and xray was performed in office. Will fax notes and notes are in [place].

## 2016-03-20 NOTE — Telephone Encounter (Signed)
Probably best to reduce carvedilol to 12.5 mg BID

## 2016-03-21 NOTE — Telephone Encounter (Signed)
I spoke with Antonio Lawrence and made her aware of recommended medication adjustment. The pt will make this change tonight and will take Carvedilol '25mg'$  one-half tablet by mouth twice a day.

## 2016-03-21 NOTE — Telephone Encounter (Signed)
Follow Up  Pt wife returned the call

## 2016-03-21 NOTE — Telephone Encounter (Signed)
Left message on machine for Antonio Lawrence to contact the office.   

## 2016-03-26 ENCOUNTER — Telehealth: Payer: Self-pay | Admitting: Cardiovascular Disease

## 2016-03-26 NOTE — Telephone Encounter (Signed)
Left message on machine for Diane to contact the office.    The pt was seen by PCP 03/15/2016 and diagnosed with URI.  Per copy of office visit Flonase Rx given and CXR performed that showed scarring with volume loss in the right, stable. No edema or consolidations. Stable cardiac silhouette. They advised the pt to contact cardiology if SOB continued.

## 2016-03-26 NOTE — Telephone Encounter (Signed)
I spoke with Diane and she said the pt tried to push mow the yard yesterday and only went a short distance before he had to stop due to SOB.  This the same type of spell that prompted the pt to seek evaluation by PCP on 03/15/16.  Per Diane the pt only has SOB when he has tried to [employer] the yard. 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  I asked her if the pt is taking flonase as recommended since he is having nasal drainage and she said the pt has not been taking this medication to her knowledge.  She will speak with the pt about taking flonase and will contact the office if SOB continues. I will go ahead and forward this message to Dr Burt Knack to review so that we can have a plan in place if SOB continues or new symptoms arise. Diane agreed with plan.

## 2016-03-26 NOTE — Telephone Encounter (Signed)
New MEssage  Pt wife calling to speak w/ RN- concerning pt's breathing. Please call back and discuss.

## 2016-03-28 NOTE — Telephone Encounter (Signed)
Would continue with PCP follow-up and if persistent symptoms despite Rx then please schedule an APP visit for evaluation.   Sherren Mocha 03/28/2016 2:43 PM

## 2016-04-01 NOTE — Telephone Encounter (Signed)
Diane came into the office today with her father and she said Mr Heier was not taking Flonase per PCP recommendation.  He has started this medication and noticed improvement in his SOB.  Diane will contact the office with any other questions or concerns.

## 2016-04-19 ENCOUNTER — Telehealth: Payer: Self-pay | Admitting: Cardiovascular Disease

## 2016-04-19 NOTE — Telephone Encounter (Signed)
New Message  Pt wife called to give an update on the pt's breathing. No further details. Please call

## 2016-04-19 NOTE — Telephone Encounter (Signed)
Left message on machine for Antonio Lawrence to contact the office.   

## 2016-04-22 NOTE — Telephone Encounter (Signed)
I spoke with Diane and she said the pt went out Friday afternoon at 4 in the afternoon and attempted to push mow the yard and became SOB.  The pt has had a number of SOB spells and this only occurs when he mows the yard.  The pt's PCP felt like he has issues with allergies.  The pt is not having symptoms when he exerts himself otherwise or at rest.  I instructed Diane to have the pt contact the PCP again to discuss this issue and if cardiology needs to evaluate then we can arrange appointment.

## 2016-04-22 NOTE — Telephone Encounter (Signed)
Left message on machine for Antonio Lawrence to contact the office.   

## 2016-04-22 NOTE — Telephone Encounter (Signed)
Follow Up   Pt wife returned the call. Please call

## 2016-05-09 ENCOUNTER — Other Ambulatory Visit: Payer: Self-pay | Admitting: Physician Assistant

## 2016-08-14 ENCOUNTER — Other Ambulatory Visit: Payer: Self-pay | Admitting: Physician Assistant

## 2016-08-14 DIAGNOSIS — I10 Essential (primary) hypertension: Secondary | ICD-10-CM

## 2016-08-14 DIAGNOSIS — E785 Hyperlipidemia, unspecified: Secondary | ICD-10-CM

## 2016-09-03 ENCOUNTER — Telehealth: Payer: Self-pay | Admitting: Cardiovascular Disease

## 2016-09-03 NOTE — Telephone Encounter (Signed)
New message      Talk to the nurse about patient's heart rate

## 2016-09-03 NOTE — Telephone Encounter (Signed)
Left message on machine for Antonio Lawrence to contact the office.   

## 2016-09-03 NOTE — Telephone Encounter (Signed)
I spoke with Diane and she called because the pt's pulse has been dipping in the lower 50s.  She wanted to see if that was okay.  I made her aware that 33 and above is okay but if the pt notices his pulse dropping into the 40s then we should be notified.  The pt's pulse in EPIC has been 56, 52 and 54. She also called to arrange the pt's follow-up appointment in January.

## 2016-09-03 NOTE — Telephone Encounter (Signed)
Pt is wife is returning phone call

## 2016-11-28 ENCOUNTER — Other Ambulatory Visit: Payer: Self-pay | Admitting: Physician Assistant

## 2016-11-28 DIAGNOSIS — I10 Essential (primary) hypertension: Secondary | ICD-10-CM

## 2016-12-09 ENCOUNTER — Encounter: Payer: Self-pay | Admitting: Cardiovascular Disease

## 2016-12-09 ENCOUNTER — Ambulatory Visit (INDEPENDENT_AMBULATORY_CARE_PROVIDER_SITE_OTHER): Payer: Medicare HMO | Admitting: Cardiovascular Disease

## 2016-12-09 ENCOUNTER — Encounter (INDEPENDENT_AMBULATORY_CARE_PROVIDER_SITE_OTHER): Payer: Self-pay

## 2016-12-09 VITALS — BP 142/76 | HR 48 | Ht 68.0 in | Wt 182.4 lb

## 2016-12-09 DIAGNOSIS — Z8719 Personal history of other diseases of the digestive system: Secondary | ICD-10-CM

## 2016-12-09 DIAGNOSIS — I10 Essential (primary) hypertension: Secondary | ICD-10-CM

## 2016-12-09 DIAGNOSIS — R001 Bradycardia, unspecified: Secondary | ICD-10-CM

## 2016-12-09 DIAGNOSIS — E785 Hyperlipidemia, unspecified: Secondary | ICD-10-CM | POA: Diagnosis not present

## 2016-12-09 DIAGNOSIS — I2510 Atherosclerotic heart disease of native coronary artery without angina pectoris: Secondary | ICD-10-CM | POA: Diagnosis not present

## 2016-12-09 NOTE — Progress Notes (Signed)
Cardiology Office Note Date:  12/09/2016   ID:  Antonio Lawrence, DOB Dec 19, 1945, MRN [id number]  PCP:  Woody Seller, MD  Cardiologist:  Sherren Mocha, MD    Chief Complaint  Patient presents with  . Coronary Artery Disease    9 month f/u     History of Present Illness: Antonio Lawrence is a 71 y.o. male who presents for Follow-up of coronary artery disease. The patient had an acute inferoposterior STEMI in 2015 treated with primary PCI of the first obtuse marginal branch of the circumflex looks. His right coronary artery was occluded with attempts made to open this vessel. This was unsuccessful and it was presumed to be a chronic total occlusion. The LAD had no high-grade obstruction. The patient's LVEF has been well preserved.  The patient is here alone today. He is doing very well. He goes to the gym 2 days every week and exercises, mostly using the weightlifting machines. He denies exertional chest pain or dyspnea. He denies lightheadedness, weakness, or syncope. He brings in home blood pressure readings that demonstrate excellent pressure control. His heart rate generally runs in the low 50s. He denies any bleeding symptoms.  Past Medical History:  Diagnosis Date  . Coronary artery disease    Chronic occlusion of the RCA with left-to-right collaterals, PCI of the left circumflex/obtuse marginal, moderate nonobstructive disease of the LAD  . Hyperglycemia   . Hyperlipidemia   . Hypertension   . Lung cancer ([place])   . Myocardial infarction    Inferoposterior STEMI October 2015, PCI of the first OM branch with a drug-eluting stent    Past Surgical History:  Procedure Laterality Date  . COLONOSCOPY WITH PROPOFOL N/A 01/13/2015   Procedure: COLONOSCOPY WITH PROPOFOL;  Surgeon: Beryle Beams, MD;  Location: [facility];  Service: Endoscopy;  Laterality: N/A;  . ENTEROSCOPY N/A 01/13/2015   Procedure: ENTEROSCOPY with propofol;  Surgeon: Beryle Beams, MD;  Location: [facility]  ENDOSCOPY;  Service: Endoscopy;  Laterality: N/A;  . LEFT HEART CATH Bilateral 09/03/2014   Procedure: LEFT HEART CATH;  Surgeon: Laverda Page, MD;  Location: [facility] CATH LAB;  Service: Cardiovascular;  Laterality: Bilateral;  . LOBECTOMY      Current Outpatient Prescriptions  Medication Sig Dispense Refill  . aspirin EC 81 MG EC tablet Take 1 tablet (81 mg total) by mouth daily.    Marland Kitchen atorvastatin (LIPITOR) 80 MG tablet TAKE ONE TABLET BY MOUTH ONCE DAILY AT 6 PM 30 tablet 9  . carvedilol (COREG) 25 MG tablet Take 0.5 tablets (12.5 mg total) by mouth 2 (two) times daily with a meal. 30 tablet 6  . lisinopril-hydrochlorothiazide (PRINZIDE,ZESTORETIC) 20-12.5 MG tablet TAKE ONE TABLET BY MOUTH ONCE DAILY 90 tablet 0  . nitroGLYCERIN (NITROSTAT) 0.4 MG SL tablet Place 1 tablet (0.4 mg total) under the tongue every 5 (five) minutes x 3 doses as needed for chest pain. 30 tablet 4   No current facility-administered medications for this visit.     Allergies:   Patient has no known allergies.   Social History:  The patient  reports that he quit smoking about 12 years ago. His smoking use included Cigarettes. He has never used smokeless tobacco. He reports that he does not drink alcohol or use drugs.   Family History:  The patient's  family history includes Cancer in his father; Heart attack in his mother.    ROS:  Please see the history of present illness. All other systems are reviewed  and negative.    PHYSICAL EXAM: VS:  BP (!) 142/76   Pulse (!) 48   Ht '5\' 8"'$  (1.727 m)   Wt 182 lb 6.4 oz (82.7 kg)   BMI 27.73 kg/m  , BMI Body mass index is 27.73 kg/m. GEN: Well nourished, well developed, in no acute distress  HEENT: normal  Neck: no JVD, no masses. No carotid bruits Cardiac: bradycardic and regular without murmur or gallop                Respiratory:  clear to auscultation bilaterally, normal work of breathing GI: soft, nontender, nondistended, + BS MS: no deformity or atrophy    Ext: no pretibial edema, pedal pulses 2+= bilaterally Skin: warm and dry, no rash Neuro:  Strength and sensation are intact Psych: euthymic mood, full affect  EKG:  EKG is ordered today. The ekg ordered today shows   Rmarked sinus bradycardia 48 bpm, nonspecific ST and T-wave abnormality.  Recent Labs: No results found for requested labs within last 8760 hours.   Lipid Panel     Component Value Date/Time   CHOL 136 12/01/2015 0957   TRIG 113 12/01/2015 0957   HDL 41 12/01/2015 0957   CHOLHDL 3.3 12/01/2015 0957   VLDL 23 12/01/2015 0957   LDLCALC 72 12/01/2015 0957      Wt Readings from Last 3 Encounters:  12/09/16 182 lb 6.4 oz (82.7 kg)  03/04/16 183 lb 1.9 oz (83.1 kg)  12/01/15 181 lb (82.1 kg)      ASSESSMENT AND PLAN: 1.  CAD, native vessel, without angina: The patient appears stable on his current medical regimen which includes aspirin, beta blocker, and a statin drug. No changes are made today. He will return in one year unless problems arise.  2. Hyperlipidemia: Treated with atorvastatin 80 mg. He is due for labs which will be drawn today.  3. Hypertension: Blood pressure is controlled on carvedilol, lisinopril, and hydrochlorothiazide. Will update a metabolic panel today.  4. Sinus bradycardia: The patient is asymptomatic. He will continue his beta blocker at current dose. He is counseled regarding potential symptoms of lightheadedness, dizziness, or weakness. If symptoms arise he will contact us and we will decrease his beta blocker if needed.  5. Hx GI bleed: check CBC today. No sx's of blood in stool or melena.   Current medicines are reviewed with the patient today.  The patient does not have concerns regarding medicines.  Labs/ tests ordered today include:   Orders Placed This Encounter  Procedures  . CBC  . Comp Met (CMET)  . Lipid panel  . EKG 12-Lead    Disposition:   FU one year  Signed, Sherren Mocha, MD  12/09/2016 1:45 PM    [facility] [address], [place], [place]  [address] Phone: [phone]; Fax: [phone]

## 2016-12-09 NOTE — Patient Instructions (Signed)
Medication Instructions:  Your physician recommends that you continue on your current medications as directed. Please refer to the Current Medication list given to you today.  Labwork: Your physician recommends that you have lab work today: CMP, CBC and LIPID  Testing/Procedures: No new orders.   Follow-Up: Your physician wants you to follow-up in: 1 YEAR with Dr Burt Knack.  You will receive a reminder letter in the mail two months in advance. If you don't receive a letter, please call our office to schedule the follow-up appointment.   Any Other Special Instructions Will Be Listed Below (If Applicable).     If you need a refill on your cardiac medications before your next appointment, please call your pharmacy.

## 2016-12-10 LAB — CBC
Hematocrit: 41.6 % (ref 37.5–51.0)
Hemoglobin: 13.4 g/dL (ref 13.0–17.7)
MCH: 29.8 pg (ref 26.6–33.0)
MCHC: 32.2 g/dL (ref 31.5–35.7)
MCV: 93 fL (ref 79–97)
Platelets: 260 10*3/uL (ref 150–379)
RBC: 4.49 x10E6/uL (ref 4.14–5.80)
RDW: 13.6 % (ref 12.3–15.4)
WBC: 5.8 10*3/uL (ref 3.4–10.8)

## 2016-12-10 LAB — LIPID PANEL
Chol/HDL Ratio: 4.1 ratio units (ref 0.0–5.0)
Cholesterol, Total: 144 mg/dL (ref 100–199)
HDL: 35 mg/dL — ABNORMAL LOW (ref >39)
LDL Calculated: 81 mg/dL (ref 0–99)
Triglycerides: 142 mg/dL (ref 0–149)
VLDL Cholesterol Cal: 28 mg/dL (ref 5–40)

## 2016-12-10 LAB — COMPREHENSIVE METABOLIC PANEL
ALT: 22 IU/L (ref 0–44)
AST: 18 IU/L (ref 0–40)
Albumin/Globulin Ratio: 1.5 (ref 1.2–2.2)
Albumin: 4.1 g/dL (ref 3.5–4.8)
Alkaline Phosphatase: 77 IU/L (ref 39–117)
BUN/Creatinine Ratio: 15 (ref 10–24)
BUN: 17 mg/dL (ref 8–27)
Bilirubin Total: 0.4 mg/dL (ref 0.0–1.2)
CO2: 27 mmol/L (ref 18–29)
Calcium: 9.3 mg/dL (ref 8.6–10.2)
Chloride: 100 mmol/L (ref 96–106)
Creatinine, Ser: 1.14 mg/dL (ref 0.76–1.27)
GFR calc Af Amer: 75 mL/min/{1.73_m2} (ref >59)
GFR calc non Af Amer: 65 mL/min/{1.73_m2} (ref >59)
Globulin, Total: 2.8 g/dL (ref 1.5–4.5)
Glucose: 102 mg/dL — ABNORMAL HIGH (ref 65–99)
Potassium: 4.4 mmol/L (ref 3.5–5.2)
Sodium: 138 mmol/L (ref 134–144)
Total Protein: 6.9 g/dL (ref 6.0–8.5)

## 2017-02-26 ENCOUNTER — Other Ambulatory Visit: Payer: Self-pay | Admitting: Cardiovascular Disease

## 2017-03-01 ENCOUNTER — Other Ambulatory Visit: Payer: Self-pay | Admitting: Physician Assistant

## 2017-03-01 DIAGNOSIS — I10 Essential (primary) hypertension: Secondary | ICD-10-CM

## 2017-03-10 ENCOUNTER — Telehealth: Payer: Self-pay | Admitting: Cardiovascular Disease

## 2017-03-10 NOTE — Telephone Encounter (Signed)
I spoke with Antonio Lawrence and she called the office to let us know that the pt was SOB. The pt had been outside picking up sticks in the yard when he became SOB almost to the point of wheezing. 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  The pt had similar symptoms last year during this time (mowing the yard) and SOB was felt to be related to possible allergies and the pt had been given an inhaler.  I instructed Antonio Lawrence to have the pt follow-up with PCP and if they feel like Cardiology needs to evaluate then they will contact the office.  Antonio Lawrence agreed with plan.

## 2017-03-10 NOTE — Telephone Encounter (Signed)
New message    Pt daughter is calling asking to be called back about pt.

## 2017-03-12 ENCOUNTER — Other Ambulatory Visit: Payer: Self-pay | Admitting: Cardiovascular Disease

## 2017-03-13 NOTE — Telephone Encounter (Signed)
Medication Detail    Disp Refills Start End   atorvastatin (LIPITOR) 80 MG tablet 30 tablet 9 02/26/2017    Sig: TAKE ONE TABLET BY MOUTH ONCE DAILY AT 6PM   E-Prescribing Status: Receipt confirmed by pharmacy (02/26/2017 10:49 AM EDT)   Pharmacy   [facility] [place], [place] [id number]

## 2017-03-17 ENCOUNTER — Other Ambulatory Visit: Payer: Self-pay | Admitting: Physician Assistant

## 2017-03-17 DIAGNOSIS — I10 Essential (primary) hypertension: Secondary | ICD-10-CM

## 2017-03-17 DIAGNOSIS — E785 Hyperlipidemia, unspecified: Secondary | ICD-10-CM

## 2017-11-13 ENCOUNTER — Telehealth: Payer: Self-pay | Admitting: Cardiovascular Disease

## 2017-11-13 NOTE — Telephone Encounter (Signed)
Patient';s wife called to schedule 1 year follow-up. Scheduled patient 1/14 with Dr. Burt Knack. She was grateful for call and agrees with treatment plan.

## 2017-11-13 NOTE — Telephone Encounter (Signed)
Diane(pt wife) calling, would like to speak with you.

## 2017-11-24 ENCOUNTER — Ambulatory Visit: Payer: Medicare HMO | Admitting: Cardiovascular Disease

## 2017-11-24 ENCOUNTER — Telehealth: Payer: Self-pay | Admitting: Cardiovascular Disease

## 2017-11-24 ENCOUNTER — Encounter: Payer: Self-pay | Admitting: Cardiovascular Disease

## 2017-11-24 VITALS — BP 158/80 | HR 68 | Ht 68.0 in | Wt 181.8 lb

## 2017-11-24 DIAGNOSIS — I4891 Unspecified atrial fibrillation: Secondary | ICD-10-CM | POA: Diagnosis not present

## 2017-11-24 DIAGNOSIS — E785 Hyperlipidemia, unspecified: Secondary | ICD-10-CM | POA: Diagnosis not present

## 2017-11-24 DIAGNOSIS — I2510 Atherosclerotic heart disease of native coronary artery without angina pectoris: Secondary | ICD-10-CM

## 2017-11-24 DIAGNOSIS — I10 Essential (primary) hypertension: Secondary | ICD-10-CM | POA: Diagnosis not present

## 2017-11-24 LAB — COMPREHENSIVE METABOLIC PANEL
ALT: 20 IU/L (ref 0–44)
AST: 17 IU/L (ref 0–40)
Albumin/Globulin Ratio: 1.3 (ref 1.2–2.2)
Albumin: 4 g/dL (ref 3.5–4.8)
Alkaline Phosphatase: 87 IU/L (ref 39–117)
BUN/Creatinine Ratio: 15 (ref 10–24)
BUN: 17 mg/dL (ref 8–27)
Bilirubin Total: 0.5 mg/dL (ref 0.0–1.2)
CO2: 27 mmol/L (ref 20–29)
Calcium: 9.3 mg/dL (ref 8.6–10.2)
Chloride: 103 mmol/L (ref 96–106)
Creatinine, Ser: 1.16 mg/dL (ref 0.76–1.27)
GFR calc Af Amer: 73 mL/min/{1.73_m2} (ref >59)
GFR calc non Af Amer: 63 mL/min/{1.73_m2} (ref >59)
Globulin, Total: 3 g/dL (ref 1.5–4.5)
Glucose: 100 mg/dL — ABNORMAL HIGH (ref 65–99)
Potassium: 4.3 mmol/L (ref 3.5–5.2)
Sodium: 140 mmol/L (ref 134–144)
Total Protein: 7 g/dL (ref 6.0–8.5)

## 2017-11-24 LAB — CBC WITH DIFFERENTIAL/PLATELET
Basophils Absolute: 0 10*3/uL (ref 0.0–0.2)
Basos: 1 %
EOS (ABSOLUTE): 0.3 10*3/uL (ref 0.0–0.4)
Eos: 4 %
Hematocrit: 41 % (ref 37.5–51.0)
Hemoglobin: 13.9 g/dL (ref 13.0–17.7)
Immature Grans (Abs): 0 10*3/uL (ref 0.0–0.1)
Immature Granulocytes: 0 %
Lymphocytes Absolute: 1.3 10*3/uL (ref 0.7–3.1)
Lymphs: 21 %
MCH: 30.8 pg (ref 26.6–33.0)
MCHC: 33.9 g/dL (ref 31.5–35.7)
MCV: 91 fL (ref 79–97)
Monocytes Absolute: 0.6 10*3/uL (ref 0.1–0.9)
Monocytes: 9 %
Neutrophils Absolute: 4.3 10*3/uL (ref 1.4–7.0)
Neutrophils: 65 %
Platelets: 292 10*3/uL (ref 150–379)
RBC: 4.51 x10E6/uL (ref 4.14–5.80)
RDW: 13.8 % (ref 12.3–15.4)
WBC: 6.5 10*3/uL (ref 3.4–10.8)

## 2017-11-24 LAB — LIPID PANEL
Chol/HDL Ratio: 3.5 ratio (ref 0.0–5.0)
Cholesterol, Total: 154 mg/dL (ref 100–199)
HDL: 44 mg/dL (ref >39)
LDL Calculated: 86 mg/dL (ref 0–99)
Triglycerides: 119 mg/dL (ref 0–149)
VLDL Cholesterol Cal: 24 mg/dL (ref 5–40)

## 2017-11-24 MED ORDER — APIXABAN 5 MG PO TABS: 5.0000 mg | ORAL_TABLET | Freq: Two times a day (BID) | ORAL | 11 refills | Status: DC

## 2017-11-24 NOTE — Progress Notes (Signed)
Cardiology Office Note Date:  11/26/2017   ID:  Antonio Lawrence, DOB [birth year], MRN [id number]  PCP:  Christain Sacramento, MD  Cardiologist:  Sherren Mocha, MD    Chief Complaint  Patient presents with  . Follow-up    CAD   History of Present Illness: Antonio Lawrence is a 72 y.o. male who presents for follow-up of coronary artery disease.  The patient initially presented in 2015 with an acute inferoposterior MI treated with primary PCI of the left circumflex.  His right coronary artery is totally occluded and attempts were made to open the vessel but this was unsuccessful.  The left main and LAD had no significant obstruction.  The patient's ejection fraction has been preserved.  The patient is here alone today and continues to do well from a symptomatic perspective.  He denies chest pain, chest pressure, shortness of breath, edema, or heart palpitations.  He is compliant with his medications and feels like he is tolerating them well.   Past Medical History:  Diagnosis Date  . Coronary artery disease    Chronic occlusion of the RCA with left-to-right collaterals, PCI of the left circumflex/obtuse marginal, moderate nonobstructive disease of the LAD  . Hyperglycemia   . Hyperlipidemia   . Hypertension   . Lung cancer ([place])   . Myocardial infarction [facility])    Inferoposterior STEMI October 2015, PCI of the first OM branch with a drug-eluting stent    Past Surgical History:  Procedure Laterality Date  . COLONOSCOPY WITH PROPOFOL N/A 01/13/2015   Procedure: COLONOSCOPY WITH PROPOFOL;  Surgeon: Beryle Beams, MD;  Location: [facility];  Service: Endoscopy;  Laterality: N/A;  . ENTEROSCOPY N/A 01/13/2015   Procedure: ENTEROSCOPY with propofol;  Surgeon: Beryle Beams, MD;  Location: [facility];  Service: Endoscopy;  Laterality: N/A;  . LEFT HEART CATH Bilateral 09/03/2014   Procedure: LEFT HEART CATH;  Surgeon: Laverda Page, MD;  Location: [facility] CATH LAB;  Service:  Cardiovascular;  Laterality: Bilateral;  . LOBECTOMY      Current Outpatient Medications  Medication Sig Dispense Refill  . carvedilol (COREG) 25 MG tablet TAKE ONE-HALF TABLET BY MOUTH TWICE DAILY WITH A MEAL 90 tablet 2  . lisinopril-hydrochlorothiazide (PRINZIDE,ZESTORETIC) 20-12.5 MG tablet TAKE ONE TABLET BY MOUTH ONCE DAILY 90 tablet 2  . nitroGLYCERIN (NITROSTAT) 0.4 MG SL tablet Place 1 tablet (0.4 mg total) under the tongue every 5 (five) minutes x 3 doses as needed for chest pain. 30 tablet 4  . apixaban (ELIQUIS) 5 MG TABS tablet Take 1 tablet (5 mg total) by mouth 2 (two) times daily. 60 tablet 11  . atorvastatin (LIPITOR) 80 MG tablet TAKE 1 TABLET BY MOUTH ONCE DAILY AT 6 PM 90 tablet 3   No current facility-administered medications for this visit.     Allergies:   Patient has no known allergies.   Social History:  The patient  reports that he quit smoking about 13 years ago. His smoking use included cigarettes. he has never used smokeless tobacco. He reports that he does not drink alcohol or use drugs.   Family History:  The patient's family history includes Cancer in his father; Heart attack in his mother.    ROS:  Please see the history of present illness.  All other systems are reviewed and negative.    PHYSICAL EXAM: VS:  BP (!) 158/80   Pulse 68   Ht 5\' 8"  (1.727 m)   Wt 181 lb 12.8 oz (  82.5 kg)   SpO2 98%   BMI 27.64 kg/m  , BMI Body mass index is 27.64 kg/m. GEN: Well nourished, well developed, in no acute distress  HEENT: normal  Neck: no JVD, no masses. No carotid bruits Cardiac: irregular without murmur or gallop      Respiratory:  clear to auscultation bilaterally, normal work of breathing GI: soft, nontender, nondistended, + BS MS: no deformity or atrophy  Ext: no pretibial edema, pedal pulses 2+= bilaterally Skin: warm and dry, no rash Neuro:  Strength and sensation are intact Psych: euthymic mood, full affect  EKG:  EKG is ordered today. The  ekg ordered today shows atrial fibrillation 59 bpm, otherwise within normal limits  Recent Labs: 11/24/2017: ALT 20; BUN 17; Creatinine, Ser 1.16; Hemoglobin 13.9; Platelets 292; Potassium 4.3; Sodium 140   Lipid Panel     Component Value Date/Time   CHOL 154 11/24/2017 1123   TRIG 119 11/24/2017 1123   HDL 44 11/24/2017 1123   CHOLHDL 3.5 11/24/2017 1123   CHOLHDL 3.3 12/01/2015 0957   VLDL 23 12/01/2015 0957   LDLCALC 86 11/24/2017 1123      Wt Readings from Last 3 Encounters:  11/24/17 181 lb 12.8 oz (82.5 kg)  12/09/16 182 lb 6.4 oz (82.7 kg)  03/04/16 183 lb 1.9 oz (83.1 kg)     ASSESSMENT AND PLAN: 1.  Coronary artery disease, native vessel, without angina: The patient is stable on a combination of aspirin, a high intensity statin drug, and a beta-blocker.  I will see him back in 1 year for follow-up.  2.  Hyperlipidemia: Treated with a high intensity statin drug using atorvastatin 80 mg daily.  He is due for follow-up lab work which will be done today as he is fasting.  3.  Hypertension: He has whitecoat hypertension.  He is treated with carvedilol, lisinopril, and hydrochlorothiazide.  He reports good control of his home blood pressures, consistently less than 140/80.  4. Atrial fibrillation, controlled ventricular rate: new diagnosis, appears asymptomatic.   This patients CHA2DS2-VASc Score and unadjusted Ischemic Stroke Rate (% per year) is equal to 3.2 % stroke rate/year from a score of 3  Above score calculated as 1 point each if present [CHF, HTN, DM, Vascular=MI/PAD/Aortic Plaque, Age if 65-74, or Male] Above score calculated as 2 points each if present [Age > 75, or Stroke/TIA/TE]  We discussed implications of atrial fibrillation specifically related to stroke risk. Anticoagulation is indicated. With stable CAD but hx of STEMI I would favor switching him to eliquis 5 mg BID. He has a hx of GI bleed secondary to polyps and considering risk/benefit, it is probably  best to keep him off ASA. Will check an echo to reassess LV function and evaluate atrial size/presence of valvular disease.  Considering his hx of GI bleed, will check a CBC in one month.   Current medicines are reviewed with the patient today.  The patient does not have concerns regarding medicines.  Labs/ tests ordered today include:   Orders Placed This Encounter  Procedures  . CBC with Differential/Platelet  . Comprehensive metabolic panel  . Lipid panel  . EKG 12-Lead  . ECHOCARDIOGRAM COMPLETE    Disposition:   FU 6 months APP  Signed, Sherren Mocha, MD  11/26/2017 10:43 PM    [facility] [place], [place], [place]  [address] Phone: [phone]; Fax: [phone]

## 2017-11-24 NOTE — Telephone Encounter (Signed)
New message  Patient wife returning phone call for husband

## 2017-11-24 NOTE — Patient Instructions (Addendum)
Medication Instructions:  1) STOP ASPIRIN 2) START ELIQUIS 5 mg TWICE DAILY  Labwork: TODAY: CMET, CBC, Lipids  Testing/Procedures: Your provider has requested that you have an echocardiogram. Echocardiography is a painless test that uses sound waves to create images of your heart. It provides your doctor with information about the size and shape of your heart and how well your heart's chambers and valves are working. This procedure takes approximately one hour. There are no restrictions for this procedure.  Follow-Up: Your provider wants you to follow-up in: 1 year with Dr. Burt Knack. You will receive a reminder letter in the mail two months in advance. If you don't receive a letter, please call our office to schedule the follow-up appointment.    Any Other Special Instructions Will Be Listed Below (If Applicable).     If you need a refill on your cardiac medications before your next appointment, please call your pharmacy.

## 2017-11-24 NOTE — Telephone Encounter (Signed)
Dr. Burt Knack spoke with patient. Patient will: 1) STOP Aspirin 2) START Eliquis 5 mg BID 3) ECHO scheduled 1/21 Patient agrees with treatment plan.

## 2017-11-25 ENCOUNTER — Other Ambulatory Visit: Payer: Self-pay | Admitting: Cardiovascular Disease

## 2017-11-27 ENCOUNTER — Telehealth: Payer: Self-pay | Admitting: Cardiovascular Disease

## 2017-11-27 DIAGNOSIS — I2510 Atherosclerotic heart disease of native coronary artery without angina pectoris: Secondary | ICD-10-CM

## 2017-11-27 NOTE — Telephone Encounter (Signed)
Left message to call back  

## 2017-11-27 NOTE — Telephone Encounter (Signed)
New Message   Pt call to speak with RN to f/u from previous call from RN.

## 2017-11-27 NOTE — Telephone Encounter (Signed)
-----   Message from Sherren Mocha, MD sent at 11/26/2017 10:48 PM EST ----- Antonio Lawrence - he has a hx of GI bleed. Can you set him up for a CBC one month after starting Eliquis? thx

## 2017-11-27 NOTE — Telephone Encounter (Signed)
Returned patient's call. Left message to call back.

## 2017-11-27 NOTE — Telephone Encounter (Signed)
Spoke with DPR (patient's wife). Scheduled patient for CBC 2/15. DPR agrees with treatment plan.

## 2017-12-01 ENCOUNTER — Ambulatory Visit (HOSPITAL_COMMUNITY): Payer: Medicare HMO | Attending: Internal Medicine

## 2017-12-01 ENCOUNTER — Other Ambulatory Visit: Payer: Self-pay

## 2017-12-01 ENCOUNTER — Telehealth: Payer: Self-pay | Admitting: Cardiovascular Disease

## 2017-12-01 DIAGNOSIS — I4891 Unspecified atrial fibrillation: Secondary | ICD-10-CM

## 2017-12-01 DIAGNOSIS — I051 Rheumatic mitral insufficiency: Secondary | ICD-10-CM | POA: Diagnosis not present

## 2017-12-01 NOTE — Telephone Encounter (Signed)
Patient's wife called to get results of ECHO this AM. Informed her results are not yet ready and they will be called as soon as they are sent to Nursing. She was grateful for call back.

## 2017-12-01 NOTE — Telephone Encounter (Signed)
Antonio Lawrence is calling because she has questions about the test he took this morning . Please call   Thanks

## 2017-12-09 ENCOUNTER — Telehealth: Payer: Self-pay | Admitting: Cardiovascular Disease

## 2017-12-09 NOTE — Telephone Encounter (Signed)
Informed patient's DPR of results and verbal understanding expressed.   Patient started Eliquis 1/14. Scheduled patient for 1 month visit with Kathleen Argue, PA on 2/15 to discuss DCCV if necessary. DPR was grateful for call and agrees with treatment plan.

## 2017-12-09 NOTE — Telephone Encounter (Signed)
New message     Patient spouse calling regarding echo results

## 2017-12-09 NOTE — Telephone Encounter (Signed)
-----   Message from Sherren Mocha, MD sent at 12/07/2017  9:43 PM EST ----- Study done for new atrial fibrillation. Unremarkable exam with normal LV function, no significant valvular disease. Normal LA/RA size. Please arrange 1 month APP visit to discuss whether to pursue DCCV now that patient is anti-coagulated.

## 2017-12-10 ENCOUNTER — Telehealth: Payer: Self-pay | Admitting: *Deleted

## 2017-12-10 NOTE — Telephone Encounter (Signed)
NEED TO MOVE APPT TO 9:45 2/15 AS PROVIDER WILL BE OUT OF THE OFFICE AT 9:15 ON 2/15.Marland KitchenMarland KitchenCMF

## 2017-12-11 NOTE — Telephone Encounter (Signed)
Pt called back and is agreeable to move appt time to 9:45 from 9:15 on 2/15.

## 2017-12-15 ENCOUNTER — Other Ambulatory Visit: Payer: Self-pay | Admitting: Physician Assistant

## 2017-12-15 DIAGNOSIS — I10 Essential (primary) hypertension: Secondary | ICD-10-CM

## 2017-12-25 DIAGNOSIS — Z8719 Personal history of other diseases of the digestive system: Secondary | ICD-10-CM | POA: Insufficient documentation

## 2017-12-25 DIAGNOSIS — I4819 Other persistent atrial fibrillation: Secondary | ICD-10-CM | POA: Insufficient documentation

## 2017-12-25 NOTE — Progress Notes (Signed)
Cardiology Office Note:    Date:  12/26/2017   ID:  Antonio Lawrence, DOB 1946/09/16, MRN [id number]  PCP:  Christain Sacramento, MD  Cardiologist:  Sherren Mocha, MD   Referring MD: Christain Sacramento, MD   Chief Complaint  Patient presents with  . Follow-up    Atrial fibrillation    History of Present Illness:    Antonio Lawrence is a 72 y.o. male with a hx of coronary artery disease, lung cancer, hypertension, hyperlipidemia, lower GI bleeding while on dual antiplatelet therapy (prior colonoscopy with polypectomy).  He suffered an inferoposterior ST elevation myocardial infarction in October 2015 treated with primary PCI of the OM1.  RCA is known to be occluded.  Attempt to open the RCA was unsuccessful.  He was last seen by Dr. Burt Knack 11/24/17.  He was noted to be in atrial fibrillation.  He was placed on apixaban for anticoagulation.  Follow-up echo was obtained and demonstrated normal LV function with mild LVH.  He returns for follow-up with an eye towards cardioversion.  [name] returns for follow up. He is here with his wife.  He is doing well. He denies chest pain, shortness of breath, syncope, orthopnea, PND or significant pedal edema. He denies any blood in his stools or black stools. He has not missed any doses of Apixaban.   Prior CV studies:   The following studies were reviewed today:  Echo 12/01/17 Mild LVH, EF 55-60, mild MR  Echo 11/25/14 Mild LVH, EF 55-60%, normal wall motion, grade 1 diastolic dysfunction, mildly dilated ascending aorta at 42 mm, aortic root 40 mm, mild LAE, normal RVSF, mild RAE, mild TR, PASP 33 mmHg   LHC 09/03/14 LVEF of 50-55% inferior lateral wall moderate hypokinesis. RCA mid occluded, L-R collaterals LM 20-30% LCx with OM1 proximal 99% LAD mid intramyocardial bridging, mid 40%,distal 70-80% PCI:  Successful PTCA and stenting of the Proximal OM1 2.5 x 18 mm with Xience Alpine DES.     Past Medical History:  Diagnosis Date  . Coronary  artery disease    Chronic occlusion of the RCA with left-to-right collaterals, PCI of the left circumflex/obtuse marginal, moderate nonobstructive disease of the LAD  . Hyperglycemia   . Hyperlipidemia   . Hypertension   . Lung cancer ([place])   . Myocardial infarction [facility])    Inferoposterior STEMI October 2015, PCI of the first OM branch with a drug-eluting stent    Past Surgical History:  Procedure Laterality Date  . COLONOSCOPY WITH PROPOFOL N/A 01/13/2015   Procedure: COLONOSCOPY WITH PROPOFOL;  Surgeon: Beryle Beams, MD;  Location: [facility] ENDOSCOPY;  Service: Endoscopy;  Laterality: N/A;  . ENTEROSCOPY N/A 01/13/2015   Procedure: ENTEROSCOPY with propofol;  Surgeon: Beryle Beams, MD;  Location: [facility] ENDOSCOPY;  Service: Endoscopy;  Laterality: N/A;  . LEFT HEART CATH Bilateral 09/03/2014   Procedure: LEFT HEART CATH;  Surgeon: Laverda Page, MD;  Location: [facility] CATH LAB;  Service: Cardiovascular;  Laterality: Bilateral;  . LOBECTOMY      Current Medications: Current Meds  Medication Sig  . apixaban (ELIQUIS) 5 MG TABS tablet Take 1 tablet (5 mg total) by mouth 2 (two) times daily.  Antonio Lawrence atorvastatin (LIPITOR) 80 MG tablet TAKE 1 TABLET BY MOUTH ONCE DAILY AT 6 PM  . carvedilol (COREG) 25 MG tablet TAKE ONE-HALF TABLET BY MOUTH TWICE DAILY WITH A MEAL  . lisinopril-hydrochlorothiazide (PRINZIDE,ZESTORETIC) 20-12.5 MG tablet TAKE 1 TABLET BY MOUTH ONCE DAILY  . nitroGLYCERIN (NITROSTAT)  0.4 MG SL tablet Place 1 tablet (0.4 mg total) under the tongue every 5 (five) minutes x 3 doses as needed for chest pain.     Allergies:   Patient has no known allergies.   Social History   Tobacco Use  . Smoking status: Former Smoker    Types: Cigarettes    Last attempt to quit: 12/26/2003    Years since quitting: 14.0  . Smokeless tobacco: Never Used  Substance Use Topics  . Alcohol use: No  . Drug use: No     Family Hx: The patient's family history includes Cancer in his father; Heart  attack in his mother.  ROS:   Please see the history of present illness.    ROS All other systems reviewed and are negative.   EKGs/Labs/Other Test Reviewed:    EKG:  EKG is  ordered today.  The ekg ordered today demonstrates atrial fibrillation, HR 44  Recent Labs: 11/24/2017: ALT 20; BUN 17; Creatinine, Ser 1.16; Hemoglobin 13.9; Platelets 292; Potassium 4.3; Sodium 140   Recent Lipid Panel Lab Results  Component Value Date/Time   CHOL 154 11/24/2017 11:23 AM   TRIG 119 11/24/2017 11:23 AM   HDL 44 11/24/2017 11:23 AM   CHOLHDL 3.5 11/24/2017 11:23 AM   CHOLHDL 3.3 12/01/2015 09:57 AM   LDLCALC 86 11/24/2017 11:23 AM    Physical Exam:    VS:  BP 118/64   Pulse (!) 44   Ht 5\' 8"  (1.727 m)   Wt 183 lb 6.4 oz (83.2 kg)   SpO2 92%   BMI 27.89 kg/m     Wt Readings from Last 3 Encounters:  12/26/17 183 lb 6.4 oz (83.2 kg)  11/24/17 181 lb 12.8 oz (82.5 kg)  12/09/16 182 lb 6.4 oz (82.7 kg)     Physical Exam  Constitutional: He is oriented to person, place, and time. He appears well-developed and well-nourished. No distress.  HENT:  Head: Normocephalic and atraumatic.  Neck: Neck supple.  Cardiovascular: An irregularly irregular rhythm present. Bradycardia present.  No murmur heard. Pulmonary/Chest: Effort normal. He has no rales.  Abdominal: Soft.  Musculoskeletal: He exhibits no edema.  Neurological: He is alert and oriented to person, place, and time.  Skin: Skin is warm and dry.    ASSESSMENT & PLAN:    1.  Persistent atrial fibrillation ([place])  He remains in atrial fibrillation with slow ventricular response.  He has a history of bradycardia and has been asymptomatic with this.  We discussed the risks and benefits of proceeding with cardioversion.  He agrees to proceed.  He has been tolerating Apixaban well.  He denies any bleeding issues.  CHADS2-VASc=37 (72 years old, HTN, CAD).  -Follow-up BMET, CBC today  -Arrange DCCV with Dr. Stanford Breed 01/09/18  -Follow-up  1-2 weeks after cardioversion  2.  Coronary artery disease History of MI in October 2015 treated with DES to the OM1.  RCA is known to be occluded.  He denies anginal symptoms.  He is no longer on aspirin he is now apixaban.  Continue statin, beta-blocker, ACE inhibitor  3.  Essential hypertension The patient's blood pressure is controlled on his current regimen.  Continue current therapy.   4.  Hyperlipidemia, unspecified hyperlipidemia type LDL optimal on most recent lab work.  Continue current Rx.    5.  History of GI bleed No obvious recurrence.  Repeat CBC is pending today.  He is no longer on antiplatelet therapy given history of GI bleeding and need  for oral anticoagulation.  Dispo:  Return in about 4 weeks (around 01/23/2018) for Post Procedure Follow Up, w/ Dr. Burt Knack, or Richardson Dopp, PA-C.   Medication Adjustments/Labs and Tests Ordered: Current medicines are reviewed at length with the patient today.  Concerns regarding medicines are outlined above.  Tests Ordered: Orders Placed This Encounter  Procedures  . Basic metabolic panel  . CBC  . EKG 12-Lead   Medication Changes: No orders of the defined types were placed in this encounter.   Signed, Richardson Dopp, PA-C  12/26/2017 10:39 AM    [facility] [address], [place], [place]  [address] Phone: [phone]; Fax: [phone]

## 2017-12-26 ENCOUNTER — Encounter: Payer: Self-pay | Admitting: Physician Assistant

## 2017-12-26 ENCOUNTER — Ambulatory Visit: Payer: Medicare HMO | Admitting: Physician Assistant

## 2017-12-26 ENCOUNTER — Encounter: Payer: Self-pay | Admitting: *Deleted

## 2017-12-26 ENCOUNTER — Other Ambulatory Visit: Payer: Medicare HMO

## 2017-12-26 VITALS — BP 118/64 | HR 44 | Ht 68.0 in | Wt 183.4 lb

## 2017-12-26 DIAGNOSIS — E785 Hyperlipidemia, unspecified: Secondary | ICD-10-CM

## 2017-12-26 DIAGNOSIS — I481 Persistent atrial fibrillation: Secondary | ICD-10-CM | POA: Diagnosis not present

## 2017-12-26 DIAGNOSIS — I2510 Atherosclerotic heart disease of native coronary artery without angina pectoris: Secondary | ICD-10-CM

## 2017-12-26 DIAGNOSIS — Z8719 Personal history of other diseases of the digestive system: Secondary | ICD-10-CM

## 2017-12-26 DIAGNOSIS — I10 Essential (primary) hypertension: Secondary | ICD-10-CM | POA: Diagnosis not present

## 2017-12-26 DIAGNOSIS — I4819 Other persistent atrial fibrillation: Secondary | ICD-10-CM

## 2017-12-26 NOTE — Patient Instructions (Addendum)
Medication Instructions:   Your physician recommends that you continue on your current medications as directed. Please refer to the Current Medication list given to you today.   If you need a refill on your cardiac medications before your next appointment, please call your pharmacy.  Labwork: CBC AND BMET TODAY    Procedures /Tests:   SEE LETTER FOR CARDIOVERSION   Follow-Up: 1 TO 2 WEEKS AFTER 01-09-18 WITH COOPER OR  WEAVER POST CARDIOVERSION   Any Other Special Instructions Will Be Listed Below (If Applicable).

## 2017-12-27 LAB — BASIC METABOLIC PANEL
BUN/Creatinine Ratio: 15 (ref 10–24)
BUN: 20 mg/dL (ref 8–27)
CO2: 24 mmol/L (ref 20–29)
Calcium: 9.1 mg/dL (ref 8.6–10.2)
Chloride: 100 mmol/L (ref 96–106)
Creatinine, Ser: 1.31 mg/dL — ABNORMAL HIGH (ref 0.76–1.27)
GFR calc Af Amer: 63 mL/min/{1.73_m2} (ref >59)
GFR calc non Af Amer: 54 mL/min/{1.73_m2} — ABNORMAL LOW (ref >59)
Glucose: 71 mg/dL (ref 65–99)
Potassium: 4.5 mmol/L (ref 3.5–5.2)
Sodium: 137 mmol/L (ref 134–144)

## 2017-12-27 LAB — CBC
Hematocrit: 38.5 % (ref 37.5–51.0)
Hemoglobin: 13.1 g/dL (ref 13.0–17.7)
MCH: 30.8 pg (ref 26.6–33.0)
MCHC: 34 g/dL (ref 31.5–35.7)
MCV: 91 fL (ref 79–97)
Platelets: 268 10*3/uL (ref 150–379)
RBC: 4.25 x10E6/uL (ref 4.14–5.80)
RDW: 13.4 % (ref 12.3–15.4)
WBC: 6.3 10*3/uL (ref 3.4–10.8)

## 2017-12-28 ENCOUNTER — Other Ambulatory Visit: Payer: Self-pay | Admitting: Physician Assistant

## 2017-12-28 DIAGNOSIS — N179 Acute kidney failure, unspecified: Secondary | ICD-10-CM

## 2017-12-29 ENCOUNTER — Other Ambulatory Visit: Payer: Self-pay | Admitting: Cardiovascular Disease

## 2017-12-29 ENCOUNTER — Telehealth: Payer: Self-pay | Admitting: *Deleted

## 2017-12-29 ENCOUNTER — Encounter: Payer: Self-pay | Admitting: *Deleted

## 2017-12-29 DIAGNOSIS — E785 Hyperlipidemia, unspecified: Secondary | ICD-10-CM

## 2017-12-29 DIAGNOSIS — I10 Essential (primary) hypertension: Secondary | ICD-10-CM

## 2017-12-29 NOTE — Telephone Encounter (Signed)
Follow up   Patient spouse returning call for results. Please call

## 2017-12-29 NOTE — Telephone Encounter (Signed)
-----   Message from Liliane Shi, PA-C sent at 12/28/2017  5:21 PM EST ----- See comments from 12/26/17 The hemoglobin is normal. Continue current medications and follow up as planned.  PLAN:  1. Arrange BMET 1 week (I placed order) Richardson Dopp, PA-C    12/28/2017 5:17 PM

## 2017-12-29 NOTE — Telephone Encounter (Signed)
Tried to reach the pt to go over lab results and recommendations, though no answer. I will try again later.

## 2017-12-30 NOTE — Telephone Encounter (Signed)
-----   Message from Michae Kava, [place] sent at 12/29/2017  2:18 PM EST ----- Regarding: Pt's wife upset  Hi Con Memos and [place],  I went over pt's lab results today with pt's wife who after the results were given proceeded to the conversation below.  Thank you Arbie Cookey    Mrs. Mcdermid proceeded to ask me if the procedure Mr. Tagg is having is dangerous, she did say that Dr. Burt Knack assure them this is common procedure for our doctors. I saw pt is scheduled for a DCCV with Dr. Stanford Breed on 01/09/18. I asked were they not given any instructions as to procedure. Pt's wife said no, they were just given a letter stating to be at the hospital by 6:30 am on 01/09/18.  I confirmed the instructions on the letter with Mrs. Maulden.   When I read instructions for the pt to make sure to take his coumadin, Mrs. Piatek said pt is not on coumadin. I confirmed with Mrs. Mccallister I see the pt is on Eliquis, which she answered yes to. I apologized to her for the error on the instruction letter. Mrs. Pfister said she wants to s/w either Dr. Burt Knack or Richardson Dopp, PA, that she is not comfortable with the mistake made about the medication on instruction letter. Mrs. Wysong asked what was the person's name who went over these instructions on Friday 12/26/17, I answered Shana.   Mrs. Risk said she wants to know who the management person is that she would need to speak with as well. I gave her Loraine Grip, BSN, Publishing copy. She asked if Con Memos would please call her. I told her that I will pass our conversation on to [facility] for further review with Mrs. Frix. Mrs. Soh thanked me for my call. At the end of my call with Mrs. Dimmick I did confirm they are aware of all instructions clearly for the DCCV 01/09/18.

## 2017-12-30 NOTE — Telephone Encounter (Signed)
15:61 Late entry message left for patient's wife as requested per C Fiato.

## 2017-12-31 NOTE — Telephone Encounter (Signed)
12/31/2017 8:40 Spoke with patient's wife who was concerned about receiving education regarding coumadin versus eliquis (which is what the patient is taking).  Assured Mrs. Berns that follow up education would be provided to staff to prevent this type of error in the future.  She verbalized appreciation.  Georgana Curio MHA RN CCM

## 2018-01-02 ENCOUNTER — Telehealth: Payer: Self-pay | Admitting: *Deleted

## 2018-01-02 ENCOUNTER — Other Ambulatory Visit: Payer: Medicare HMO | Admitting: *Deleted

## 2018-01-02 DIAGNOSIS — N179 Acute kidney failure, unspecified: Secondary | ICD-10-CM

## 2018-01-02 LAB — BASIC METABOLIC PANEL
BUN/Creatinine Ratio: 17 (ref 10–24)
BUN: 23 mg/dL (ref 8–27)
CO2: 24 mmol/L (ref 20–29)
Calcium: 8.8 mg/dL (ref 8.6–10.2)
Chloride: 102 mmol/L (ref 96–106)
Creatinine, Ser: 1.32 mg/dL — ABNORMAL HIGH (ref 0.76–1.27)
GFR calc Af Amer: 62 mL/min/{1.73_m2} (ref >59)
GFR calc non Af Amer: 54 mL/min/{1.73_m2} — ABNORMAL LOW (ref >59)
Glucose: 105 mg/dL — ABNORMAL HIGH (ref 65–99)
Potassium: 4.7 mmol/L (ref 3.5–5.2)
Sodium: 139 mmol/L (ref 134–144)

## 2018-01-02 NOTE — Telephone Encounter (Signed)
-----   Message from Liliane Shi, [place] sent at 01/02/2018  4:07 PM EST ----- Renal function stable. Continue current medications and follow up as planned.  Richardson Dopp, PA-C    01/02/2018 4:07 PM

## 2018-01-02 NOTE — Telephone Encounter (Signed)
DPR ok to [facility]. Lab work ok. Continue current Tx plan. If any questions please call [phone].

## 2018-01-08 NOTE — Anesthesia Preprocedure Evaluation (Addendum)
Anesthesia Evaluation  Patient identified by MRN, date of birth, ID band Patient awake    Reviewed: Allergy & Precautions, NPO status , Patient's Chart, lab work & pertinent test results  Airway Mallampati: II  TM Distance: >3 FB Neck ROM: Full    Dental no notable dental hx.    Pulmonary neg pulmonary ROS, former smoker,    Pulmonary exam normal breath sounds clear to auscultation       Cardiovascular hypertension, + CAD and + Past MI  Normal cardiovascular exam Rhythm:Regular Rate:Normal     Neuro/Psych    GI/Hepatic   Endo/Other    Renal/GU      Musculoskeletal   Abdominal   Peds  Hematology negative hematology ROS (+)   Anesthesia Other Findings   Reproductive/Obstetrics                            Lab Results  Component Value Date   WBC 6.3 12/26/2017   HGB 13.1 12/26/2017   HCT 38.5 12/26/2017   MCV 91 12/26/2017   PLT 268 12/26/2017   Lab Results  Component Value Date   CREATININE 1.32 (H) 01/02/2018   BUN 23 01/02/2018   NA 139 01/02/2018   K 4.7 01/02/2018   CL 102 01/02/2018   CO2 24 01/02/2018    Anesthesia Physical Anesthesia Plan  ASA: III  Anesthesia Plan: General   Post-op Pain Management:    Induction: Intravenous  PONV Risk Score and Plan: Treatment may vary due to age or medical condition  Airway Management Planned: Mask, Natural Airway and Nasal Cannula  Additional Equipment:   Intra-op Plan:   Post-operative Plan:   Informed Consent: I have reviewed the patients History and Physical, chart, labs and discussed the procedure including the risks, benefits and alternatives for the proposed anesthesia with the patient or authorized representative who has indicated his/her understanding and acceptance.     Plan Discussed with: CRNA  Anesthesia Plan Comments:         Anesthesia Quick Evaluation

## 2018-01-09 ENCOUNTER — Other Ambulatory Visit: Payer: Self-pay

## 2018-01-09 ENCOUNTER — Encounter (HOSPITAL_COMMUNITY)
Admission: RE | Disposition: A | Discharge status: Home / Self Care | Payer: Self-pay | Source: Ambulatory Visit | Attending: Cardiology

## 2018-01-09 ENCOUNTER — Ambulatory Visit (HOSPITAL_COMMUNITY): Payer: Medicare HMO | Admitting: Anesthesiology

## 2018-01-09 ENCOUNTER — Encounter (HOSPITAL_COMMUNITY): Payer: Self-pay | Admitting: *Deleted

## 2018-01-09 ENCOUNTER — Ambulatory Visit (HOSPITAL_COMMUNITY)
Admission: RE | Admit: 2018-01-09 | Discharge: 2018-01-09 | Disposition: A | Discharge status: Home / Self Care | Payer: Medicare HMO | Source: Ambulatory Visit | Attending: Cardiology | Admitting: Cardiology

## 2018-01-09 DIAGNOSIS — Z79899 Other long term (current) drug therapy: Secondary | ICD-10-CM | POA: Diagnosis not present

## 2018-01-09 DIAGNOSIS — Z955 Presence of coronary angioplasty implant and graft: Secondary | ICD-10-CM | POA: Diagnosis not present

## 2018-01-09 DIAGNOSIS — Z87891 Personal history of nicotine dependence: Secondary | ICD-10-CM | POA: Insufficient documentation

## 2018-01-09 DIAGNOSIS — R001 Bradycardia, unspecified: Secondary | ICD-10-CM | POA: Insufficient documentation

## 2018-01-09 DIAGNOSIS — Z8249 Family history of ischemic heart disease and other diseases of the circulatory system: Secondary | ICD-10-CM | POA: Insufficient documentation

## 2018-01-09 DIAGNOSIS — Z85118 Personal history of other malignant neoplasm of bronchus and lung: Secondary | ICD-10-CM | POA: Diagnosis not present

## 2018-01-09 DIAGNOSIS — E785 Hyperlipidemia, unspecified: Secondary | ICD-10-CM | POA: Diagnosis not present

## 2018-01-09 DIAGNOSIS — I2510 Atherosclerotic heart disease of native coronary artery without angina pectoris: Secondary | ICD-10-CM | POA: Insufficient documentation

## 2018-01-09 DIAGNOSIS — I4819 Other persistent atrial fibrillation: Secondary | ICD-10-CM

## 2018-01-09 DIAGNOSIS — I481 Persistent atrial fibrillation: Secondary | ICD-10-CM | POA: Diagnosis present

## 2018-01-09 DIAGNOSIS — I10 Essential (primary) hypertension: Secondary | ICD-10-CM | POA: Diagnosis not present

## 2018-01-09 DIAGNOSIS — Z7901 Long term (current) use of anticoagulants: Secondary | ICD-10-CM | POA: Insufficient documentation

## 2018-01-09 DIAGNOSIS — I252 Old myocardial infarction: Secondary | ICD-10-CM | POA: Insufficient documentation

## 2018-01-09 HISTORY — PX: CARDIOVERSION: SHX1299

## 2018-01-09 SURGERY — CARDIOVERSION
Anesthesia: General

## 2018-01-09 MED ORDER — SODIUM CHLORIDE 0.9 % IV SOLN
250.0000 mL | INTRAVENOUS | Status: DC
  Administered 2018-01-09: 250 mL via INTRAVENOUS

## 2018-01-09 MED ORDER — LIDOCAINE HCL (CARDIAC) 20 MG/ML IV SOLN
INTRAVENOUS | Status: DC | PRN
  Administered 2018-01-09: 100 mg via INTRAVENOUS

## 2018-01-09 MED ORDER — HYDROCORTISONE 1 % EX CREA: 1.0000 "application " | TOPICAL_CREAM | Freq: Three times a day (TID) | CUTANEOUS | Status: DC | PRN

## 2018-01-09 MED ORDER — SODIUM CHLORIDE 0.9% FLUSH: 3.0000 mL | Freq: Two times a day (BID) | INTRAVENOUS | Status: DC

## 2018-01-09 MED ORDER — PROPOFOL 10 MG/ML IV BOLUS
INTRAVENOUS | Status: DC | PRN
  Administered 2018-01-09: 70 mg via INTRAVENOUS

## 2018-01-09 MED ORDER — SODIUM CHLORIDE 0.9% FLUSH: 3.0000 mL | INTRAVENOUS | Status: DC | PRN

## 2018-01-09 NOTE — Anesthesia Postprocedure Evaluation (Signed)
Anesthesia Post Note  Patient: Antonio Lawrence  Procedure(s) Performed: CARDIOVERSION (N/A )     Patient location during evaluation: Endoscopy Anesthesia Type: General Level of consciousness: awake and alert Pain management: pain level controlled Vital Signs Assessment: post-procedure vital signs reviewed and stable Respiratory status: spontaneous breathing, nonlabored ventilation, respiratory function stable and patient connected to nasal cannula oxygen Cardiovascular status: blood pressure returned to baseline and stable Postop Assessment: no apparent nausea or vomiting Anesthetic complications: no    Last Vitals:  Vitals:   01/09/18 0820 01/09/18 0830  BP: 113/63 (!) 147/63  Pulse: (!) 50 (!) 50  Resp: 12 (!) 9  Temp:    SpO2: 97% 97%    Last Pain:  Vitals:   01/09/18 0730  TempSrc: Oral                 Barnet Glasgow

## 2018-01-09 NOTE — Transfer of Care (Signed)
Immediate Anesthesia Transfer of Care Note  Patient: Antonio Lawrence  Procedure(s) Performed: CARDIOVERSION (N/A )  Patient Location: cardioversion room  Anesthesia Type:General  Level of Consciousness: awake, alert  and oriented  Airway & Oxygen Therapy: Patient Spontanous Breathing  Post-op Assessment: Report given to RN and Post -op Vital signs reviewed and stable  Post vital signs: Reviewed and stable  Last Vitals:  Vitals:   01/09/18 0730  BP: (!) 158/92  Pulse: 70  Resp: 11  Temp: 36.5 C  SpO2: 98%    Last Pain:  Vitals:   01/09/18 0730  TempSrc: Oral         Complications: No apparent anesthesia complications

## 2018-01-09 NOTE — Discharge Instructions (Signed)
Monitored Anesthesia Care, Care After These instructions provide you with information about caring for yourself after your procedure. Your health care provider may also give you more specific instructions. Your treatment has been planned according to current medical practices, but problems sometimes occur. Call your health care provider if you have any problems or questions after your procedure. What can I expect after the procedure? After your procedure, it is common to:  Feel sleepy for several hours.  Feel clumsy and have poor balance for several hours.  Feel forgetful about what happened after the procedure.  Have poor judgment for several hours.  Feel nauseous or vomit.  Have a sore throat if you had a breathing tube during the procedure.  Follow these instructions at home: For at least 24 hours after the procedure:   Do not: ? Participate in activities in which you could fall or become injured. ? Drive. ? Use heavy machinery. ? Drink alcohol. ? Take sleeping pills or medicines that cause drowsiness. ? Make important decisions or sign legal documents. ? Take care of children on your own.  Rest. Eating and drinking  Follow the diet that is recommended by your health care provider.  If you vomit, drink water, juice, or soup when you can drink without vomiting.  Make sure you have little or no nausea before eating solid foods. General instructions  Have a responsible adult stay with you until you are awake and alert.  Take over-the-counter and prescription medicines only as told by your health care provider.  If you smoke, do not smoke without supervision.  Keep all follow-up visits as told by your health care provider. This is important. Contact a health care provider if:  You keep feeling nauseous or you keep vomiting.  You feel light-headed.  You develop a rash.  You have a fever. Get help right away if:  You have trouble breathing. This information is  not intended to replace advice given to you by your health care provider. Make sure you discuss any questions you have with your health care provider. Document Released: 02/18/2016 Document Revised: 06/19/2016 Document Reviewed: 02/18/2016 Elsevier Interactive Patient Education  2018 [employer]. Electrical Cardioversion, Care After This sheet gives you information about how to care for yourself after your procedure. Your health care provider may also give you more specific instructions. If you have problems or questions, contact your health care provider. What can I expect after the procedure? After the procedure, it is common to have:  Some redness on the skin where the shocks were given.  Follow these instructions at home:  Do not drive for 24 hours if you were given a medicine to help you relax (sedative).  Take over-the-counter and prescription medicines only as told by your health care provider.  Ask your health care provider how to check your pulse. Check it often.  Rest for 48 hours after the procedure or as told by your health care provider.  Avoid or limit your caffeine use as told by your health care provider. Contact a health care provider if:  You feel like your heart is beating too quickly or your pulse is not regular.  You have a serious muscle cramp that does not go away. Get help right away if:  You have discomfort in your chest.  You are dizzy or you feel faint.  You have trouble breathing or you are short of breath.  Your speech is slurred.  You have trouble moving an arm or leg on  one side of your body.  Your fingers or toes turn cold or blue. This information is not intended to replace advice given to you by your health care provider. Make sure you discuss any questions you have with your health care provider. Document Released: 08/18/2013 Document Revised: 05/31/2016 Document Reviewed: 05/03/2016 Elsevier Interactive Patient Education  [employer].

## 2018-01-09 NOTE — H&P (Signed)
Office Visit   12/26/2017 [facility] [address]    [place], [place], [place]  Cardiology   Persistent atrial fibrillation [facility]) +4 more  Dx   Follow-up   ; Referred by Christain Sacramento, MD  Reason for Visit   Additional Documentation   Vitals:   BP 118/64   Pulse 44 Abnormal     Ht 5\' 8"  (1.727 m)   Wt 183 lb 6.4 oz (83.2 kg)   SpO2 92%   BMI 27.89 kg/m   BSA 2 m      More Vitals   Flowsheets:   Anthropometrics,   MEWS Score     Encounter Info:   Billing Info,   History,   Allergies,   Detailed Report     All Notes   Procedures by Liliane Shi, PA-C at 12/26/2017 9:45 AM   Author: Liliane Shi, PA-C Author Type: Physician Assistant Filed: 12/26/2017 4:09 PM  Note Status: Signed Cosign: Cosign Not Required Encounter Date: 12/26/2017  Editor: Sallee Provencal L        Scan on 12/26/2017 4:09 PM by Myles Rosenthal: Ekg - [facility]   Progress Notes by Liliane Shi, PA-C at 12/26/2017 9:45 AM   Author: Liliane Shi, PA-C Author Type: Physician Assistant Filed: 12/26/2017 10:39 AM  Note Status: Signed Cosign: Cosigned by Sherren Mocha, MD at 12/28/2017 12:31 PM Encounter Date: 12/26/2017  Editor: Sharmon Revere (Physician Assistant)  Expand All Collapse All    Cardiology Office Note:    Date:  12/26/2017   ID:  Antonio Lawrence, DOB [birth year], MRN [id number]  PCP:  Christain Sacramento, MD       Cardiologist:  Sherren Mocha, MD   Referring MD: Christain Sacramento, MD       Chief Complaint  Patient presents with  . Follow-up    Atrial fibrillation    History of Present Illness:    Antonio Lawrence is a 72 y.o. male with a hx of coronary artery disease, lung cancer, hypertension, hyperlipidemia, lower GI bleeding while on dual antiplatelet therapy (prior colonoscopy with polypectomy).  He suffered an inferoposterior ST elevation myocardial infarction in October 2015 treated with primary PCI of the OM1.  RCA is known to  be occluded.  Attempt to open the RCA was unsuccessful.  He was last seen by Dr. Burt Knack 11/24/17.  He was noted to be in atrial fibrillation.  He was placed on apixaban for anticoagulation.  Follow-up echo was obtained and demonstrated normal LV function with mild LVH.  He returns for follow-up with an eye towards cardioversion.  [name] returns for follow up. He is here with his wife.  He is doing well. He denies chest pain, shortness of breath, syncope, orthopnea, PND or significant pedal edema. He denies any blood in his stools or black stools. He has not missed any doses of Apixaban.   Prior CV studies:   The following studies were reviewed today:  Echo 12/01/17 Mild LVH, EF 55-60, mild MR  Echo 11/25/14 Mild LVH, EF 55-60%, normal wall motion, grade 1 diastolic dysfunction, mildly dilated ascending aorta at 42 mm, aortic root 40 mm, mild LAE, normal RVSF, mild RAE, mild TR, PASP 33 mmHg  LHC 09/03/14 LVEF of 50-55% inferior lateral wall moderate hypokinesis. RCA mid occluded, L-R collaterals LM 20-30% LCx with OM1 proximal 99% LAD mid intramyocardial bridging, mid 40%,distal 70-80% PCI: Successful PTCA and stenting of the Proximal OM1 2.5 x  18 mm with Xience Alpine DES.       Past Medical History:  Diagnosis Date  . Coronary artery disease    Chronic occlusion of the RCA with left-to-right collaterals, PCI of the left circumflex/obtuse marginal, moderate nonobstructive disease of the LAD  . Hyperglycemia   . Hyperlipidemia   . Hypertension   . Lung cancer ([place])   . Myocardial infarction [facility])    Inferoposterior STEMI October 2015, PCI of the first OM branch with a drug-eluting stent         Past Surgical History:  Procedure Laterality Date  . COLONOSCOPY WITH PROPOFOL N/A 01/13/2015   Procedure: COLONOSCOPY WITH PROPOFOL;  Surgeon: Beryle Beams, MD;  Location: [facility];  Service: Endoscopy;  Laterality: N/A;  . ENTEROSCOPY N/A 01/13/2015    Procedure: ENTEROSCOPY with propofol;  Surgeon: Beryle Beams, MD;  Location: [facility];  Service: Endoscopy;  Laterality: N/A;  . LEFT HEART CATH Bilateral 09/03/2014   Procedure: LEFT HEART CATH;  Surgeon: Laverda Page, MD;  Location: [facility] CATH LAB;  Service: Cardiovascular;  Laterality: Bilateral;  . LOBECTOMY      Current Medications: Active Medications      Current Meds  Medication Sig  . apixaban (ELIQUIS) 5 MG TABS tablet Take 1 tablet (5 mg total) by mouth 2 (two) times daily.  Marland Kitchen atorvastatin (LIPITOR) 80 MG tablet TAKE 1 TABLET BY MOUTH ONCE DAILY AT 6 PM  . carvedilol (COREG) 25 MG tablet TAKE ONE-HALF TABLET BY MOUTH TWICE DAILY WITH A MEAL  . lisinopril-hydrochlorothiazide (PRINZIDE,ZESTORETIC) 20-12.5 MG tablet TAKE 1 TABLET BY MOUTH ONCE DAILY  . nitroGLYCERIN (NITROSTAT) 0.4 MG SL tablet Place 1 tablet (0.4 mg total) under the tongue every 5 (five) minutes x 3 doses as needed for chest pain.       Allergies:   Patient has no known allergies.   Social History        Tobacco Use  . Smoking status: Former Smoker    Types: Cigarettes    Last attempt to quit: 12/26/2003    Years since quitting: 14.0  . Smokeless tobacco: Never Used  Substance Use Topics  . Alcohol use: No  . Drug use: No     Family Hx: The patient's family history includes Cancer in his father; Heart attack in his mother.  ROS:   Please see the history of present illness.    ROS All other systems reviewed and are negative.   EKGs/Labs/Other Test Reviewed:    EKG:  EKG is  ordered today.  The ekg ordered today demonstrates atrial fibrillation, HR 44  Recent Labs: 11/24/2017: ALT 20; BUN 17; Creatinine, Ser 1.16; Hemoglobin 13.9; Platelets 292; Potassium 4.3; Sodium 140   Recent Lipid Panel Labs (Brief)       Lab Results  Component Value Date/Time   CHOL 154 11/24/2017 11:23 AM   TRIG 119 11/24/2017 11:23 AM   HDL 44 11/24/2017 11:23 AM   CHOLHDL 3.5  11/24/2017 11:23 AM   CHOLHDL 3.3 12/01/2015 09:57 AM   LDLCALC 86 11/24/2017 11:23 AM      Physical Exam:    VS:  BP 118/64   Pulse (!) 44   Ht 5\' 8"  (1.727 m)   Wt 183 lb 6.4 oz (83.2 kg)   SpO2 92%   BMI 27.89 kg/m        Wt Readings from Last 3 Encounters:  12/26/17 183 lb 6.4 oz (83.2 kg)  11/24/17 181 lb 12.8 oz (82.5  kg)  12/09/16 182 lb 6.4 oz (82.7 kg)     Physical Exam  Constitutional: He is oriented to person, place, and time. He appears well-developed and well-nourished. No distress.  HENT:  Head: Normocephalic and atraumatic.  Neck: Neck supple.  Cardiovascular: An irregularly irregular rhythm present. Bradycardia present.  No murmur heard. Pulmonary/Chest: Effort normal. He has no rales.  Abdominal: Soft.  Musculoskeletal: He exhibits no edema.  Neurological: He is alert and oriented to person, place, and time.  Skin: Skin is warm and dry.    ASSESSMENT & PLAN:    1.  Persistent atrial fibrillation ([place])  He remains in atrial fibrillation with slow ventricular response.  He has a history of bradycardia and has been asymptomatic with this.  We discussed the risks and benefits of proceeding with cardioversion.  He agrees to proceed.  He has been tolerating Apixaban well.  He denies any bleeding issues.  CHADS2-VASc=17 (72 years old, HTN, CAD).             -Follow-up BMET, CBC today             -Arrange DCCV with Dr. Stanford Breed 01/09/18             -Follow-up 1-2 weeks after cardioversion  2.  Coronary artery disease History of MI in October 2015 treated with DES to the OM1.  RCA is known to be occluded.  He denies anginal symptoms.  He is no longer on aspirin he is now apixaban.  Continue statin, beta-blocker, ACE inhibitor  3.  Essential hypertension The patient's blood pressure is controlled on his current regimen.  Continue current therapy.   4.  Hyperlipidemia, unspecified hyperlipidemia type LDL optimal on most recent lab work.  Continue  current Rx.    5.  History of GI bleed No obvious recurrence.  Repeat CBC is pending today.  He is no longer on antiplatelet therapy given history of GI bleeding and need for oral anticoagulation.  Dispo:  Return in about 4 weeks (around 01/23/2018) for Post Procedure Follow Up, w/ Dr. Burt Knack, or Richardson Dopp, PA-C.   Medication Adjustments/Labs and Tests Ordered: Current medicines are reviewed at length with the patient today.  Concerns regarding medicines are outlined above.  Tests Ordered:    Orders Placed This Encounter  Procedures  . Basic metabolic panel  . CBC  . EKG 12-Lead   Medication Changes: No orders of the defined types were placed in this encounter.   Signed, Richardson Dopp, PA-C  12/26/2017 10:39 AM    [facility] [address], [place], [place]  [address] Phone: [phone]; Fax: [phone]      For DCCV; no changes; compliant with apixaban. Kirk Ruths

## 2018-01-09 NOTE — Procedures (Signed)
Electrical Cardioversion Procedure Note Antonio Lawrence [id number] [birth year]  Procedure: Electrical Cardioversion Indications:  Atrial Fibrillation  Procedure Details Consent: Risks of procedure as well as the alternatives and risks of each were explained to the (patient/caregiver).  Consent for procedure obtained. Time Out: Verified patient identification, verified procedure, site/side was marked, verified correct patient position, special equipment/implants available, medications/allergies/relevent history reviewed, required imaging and test results available.  Performed  Patient placed on cardiac monitor, pulse oximetry, supplemental oxygen as necessary.  Sedation given: Pt sedated by anesthesia with lidocaine 100 mg and diprovan 70 mg IV. Pacer pads placed anterior and posterior chest.  Cardioverted 1 time(s).  Cardioverted at 120J.  Evaluation Findings: Post procedure EKG shows: Sinus bradycardia Complications: None Patient did tolerate procedure well.   Kirk Ruths 01/09/2018, 7:41 AM

## 2018-01-12 ENCOUNTER — Telehealth: Payer: Self-pay | Admitting: Physician Assistant

## 2018-01-12 NOTE — Telephone Encounter (Signed)
I will route to [place] as Juluis Rainier.  pt and his wife asked if Dr. Burt Knack and Brynda Rim. PA would have pt f/u every few months to make sure he stays in rhythm since he was just had DCCV. Pt wife asked that I please ask for me to please send message to Dr. Burt Knack and Brynda Rim. PA.

## 2018-01-12 NOTE — Telephone Encounter (Signed)
He should have a follow up appointment 1-2 weeks after the cardioversion (arranged 3/15).  We will check his ECG at that appointment and decide on timing of future follow up. Richardson Dopp, PA-C    01/12/2018 1:24 PM

## 2018-01-12 NOTE — Telephone Encounter (Signed)
New message     Patient wife Shauna Hugh would like to speak to you about the procedure that Brennyn had. She has a few questions

## 2018-01-22 NOTE — Progress Notes (Signed)
Cardiology Office Note:    Date:  01/23/2018   ID:  Antonio Lawrence, DOB 10/07/1946, MRN [id number]  PCP:  Christain Sacramento, MD  Cardiologist:  Sherren Mocha, MD   Referring MD: Christain Sacramento, MD   Chief Complaint  Patient presents with  . Follow-up    Atrial fibrillation status post cardioversion    Patient Profile:    Antonio Lawrence is a 72 y.o. male with the following cardiac problems:  Specialty Problems      Cardiology Problems   CAD (coronary artery disease)    S/p inferoposterior STEMI in 08/2014 tx with DES to OM1.  RCA is known to be occluded - attempt to open the RCA unsuccessful.   Echo 12/01/17: Mild LVH, EF 55-60, mild MR Echo 11/25/14: Mild LVH, EF 55-60%, normal wall motion, grade 1 diastolic dysfunction, mildly dilated ascending aorta at 42 mm, aortic root 40 mm, mild LAE, normal RVSF, mild RAE, mild TR, PASP 33 mmHg LHC 09/03/14: LVEF of 50-55% inferior lateral wall moderate hypokinesis. RCA mid occluded, L-R collaterals; LM 20-30%; LCx with OM1 proximal 99%; LAD mid intramyocardial bridging, mid 40%,distal 70-80%; PCI: Successful PTCA and stenting of the Proximal OM1 2.5 x 18 mm with Xience Alpine DES.      Persistent atrial fibrillation (H[facility])    Dx in 11/2017 >> Apixaban started for anticoagulation // needs close follow up on hemoglobin due to prior hx of GI bleed // s/p DCCV 01/09/18 >> NSR      Essential hypertension   Hyperlipidemia       History of Present Illness:    Antonio Lawrence returns for follow-up after recent cardioversion.  He was noted to be in atrial fibrillation in January 2019 follow-up.  He was placed on apixaban for anticoagulation and underwent cardioversion 01/09/18.  He is here alone today.  Since his cardioversion, he has felt well.  He denies chest pain, dizziness, near-syncope, syncope, paroxysmal nocturnal dyspnea or edema.  He denies any bleeding issues.  He has dyspnea with more moderate to extreme activities.  This is chronic  without change.  Prior CV studies:   The following studies were reviewed today:  None   Past Medical History:  Diagnosis Date  . Coronary artery disease    Chronic occlusion of the RCA with left-to-right collaterals, PCI of the left circumflex/obtuse marginal, moderate nonobstructive disease of the LAD  . Hyperglycemia   . Hyperlipidemia   . Hypertension   . Lung cancer ([place])   . Myocardial infarction [facility])    Inferoposterior STEMI October 2015, PCI of the first OM branch with a drug-eluting stent   Surgical Hx: The patient  has a past surgical history that includes Lobectomy; left heart cath (Bilateral, 09/03/2014); Colonoscopy with propofol (N/A, 01/13/2015); enteroscopy (N/A, 01/13/2015); and Cardioversion (N/A, 01/09/2018).   Current Medications: Current Meds  Medication Sig  . apixaban (ELIQUIS) 5 MG TABS tablet Take 1 tablet (5 mg total) by mouth 2 (two) times daily.  Marland Kitchen atorvastatin (LIPITOR) 80 MG tablet TAKE 1 TABLET BY MOUTH ONCE DAILY AT 6 PM  . carvedilol (COREG) 25 MG tablet TAKE 1/2 (ONE-HALF) TABLET BY MOUTH TWICE DAILY WITH  A  MEAL  . lisinopril-hydrochlorothiazide (PRINZIDE,ZESTORETIC) 20-12.5 MG tablet TAKE 1 TABLET BY MOUTH ONCE DAILY  . nitroGLYCERIN (NITROSTAT) 0.4 MG SL tablet Place 1 tablet (0.4 mg total) under the tongue every 5 (five) minutes x 3 doses as needed for chest pain.     Allergies:   Patient  has no known allergies.   Social History   Tobacco Use  . Smoking status: Former Smoker    Types: Cigarettes    Last attempt to quit: 12/26/2003    Years since quitting: 14.0  . Smokeless tobacco: Never Used  Substance Use Topics  . Alcohol use: No  . Drug use: No     Family Hx: The patient's family history includes Cancer in his father; Heart attack in his mother.  ROS:   Please see the history of present illness.    ROS All other systems reviewed and are negative.   EKGs/Labs/Other Test Reviewed:    EKG:  EKG is  ordered today.  The ekg  ordered today demonstrates atrial fibrillation, heart rate 51, 2.6 sec pause  Recent Labs: 11/24/2017: ALT 20 12/26/2017: Hemoglobin 13.1; Platelets 268 01/02/2018: BUN 23; Creatinine, Ser 1.32; Potassium 4.7; Sodium 139   Recent Lipid Panel Lab Results  Component Value Date/Time   CHOL 154 11/24/2017 11:23 AM   TRIG 119 11/24/2017 11:23 AM   HDL 44 11/24/2017 11:23 AM   CHOLHDL 3.5 11/24/2017 11:23 AM   CHOLHDL 3.3 12/01/2015 09:57 AM   LDLCALC 86 11/24/2017 11:23 AM    Physical Exam:    VS:  BP 140/68   Pulse (!) 51   Ht 5\' 8"  (1.727 m)   Wt 184 lb 12.8 oz (83.8 kg)   SpO2 95%   BMI 28.10 kg/m     Wt Readings from Last 3 Encounters:  01/23/18 184 lb 12.8 oz (83.8 kg)  01/09/18 183 lb (83 kg)  12/26/17 183 lb 6.4 oz (83.2 kg)     Physical Exam  Constitutional: He is oriented to person, place, and time. He appears well-developed and well-nourished. No distress.  HENT:  Head: Normocephalic and atraumatic.  Neck: No JVD present.  Cardiovascular: An irregularly irregular rhythm present. Bradycardia present.  No murmur heard. Pulmonary/Chest: He has no rales.  Abdominal: Soft.  Musculoskeletal: He exhibits no edema.  Neurological: He is alert and oriented to person, place, and time.  Skin: Skin is warm and dry.    ASSESSMENT & PLAN:    #1.  Persistent atrial fibrillation ([place]) He is back in atrial fibrillation today.  Overall, he seems to be asymptomatic.  We discussed pros and cons of rhythm versus rate control.  As he is asymptomatic, I have recommended a strategy of rate control.  His rate is somewhat slow.  However, he has a history of bradycardia.  He is asymptomatic from this.  Continue current dose of carvedilol.  Continue Apixaban.  He would like to follow up in 3 months.   -CBC today  -FU 3 months.   #2.  Coronary artery disease Status post myocardial infarction in 2015 treated with drug-eluting stent to the OM1.  He has a chronically occluded RCA.  He is not  having angina.  He is not on aspirin as he is now on a apixaban.  Continue statin, beta-blocker, ACE inhibitor.  #3.  Essential hypertension Borderline control.  Continue current regimen.  Continue to monitor.  #4.  History of GI bleed He denies any bleeding issues.  Obtain follow-up CBC today.   Dispo:  Return in about 3 months (around 04/25/2018) for Routine Follow Up, w/ Dr. Burt Knack.   Medication Adjustments/Labs and Tests Ordered: Current medicines are reviewed at length with the patient today.  Concerns regarding medicines are outlined above.  Tests Ordered: Orders Placed This Encounter  Procedures  . CBC  .  EKG 12-Lead   Medication Changes: No orders of the defined types were placed in this encounter.   Signed, Richardson Dopp, PA-C  01/23/2018 8:54 AM    [facility] [place], [place], [place]  [address] Phone: [phone]; Fax: [phone]

## 2018-01-23 ENCOUNTER — Encounter: Payer: Self-pay | Admitting: Physician Assistant

## 2018-01-23 ENCOUNTER — Ambulatory Visit: Payer: Medicare HMO | Admitting: Physician Assistant

## 2018-01-23 ENCOUNTER — Other Ambulatory Visit: Payer: Self-pay

## 2018-01-23 ENCOUNTER — Telehealth: Payer: Self-pay | Admitting: *Deleted

## 2018-01-23 VITALS — BP 140/68 | HR 51 | Ht 68.0 in | Wt 184.8 lb

## 2018-01-23 DIAGNOSIS — I10 Essential (primary) hypertension: Secondary | ICD-10-CM | POA: Diagnosis not present

## 2018-01-23 DIAGNOSIS — I4819 Other persistent atrial fibrillation: Secondary | ICD-10-CM

## 2018-01-23 DIAGNOSIS — I2510 Atherosclerotic heart disease of native coronary artery without angina pectoris: Secondary | ICD-10-CM | POA: Diagnosis not present

## 2018-01-23 DIAGNOSIS — I481 Persistent atrial fibrillation: Secondary | ICD-10-CM | POA: Diagnosis not present

## 2018-01-23 DIAGNOSIS — Z8719 Personal history of other diseases of the digestive system: Secondary | ICD-10-CM

## 2018-01-23 LAB — CBC
Hematocrit: 36.1 % — ABNORMAL LOW (ref 37.5–51.0)
Hemoglobin: 12.4 g/dL — ABNORMAL LOW (ref 13.0–17.7)
MCH: 30.6 pg (ref 26.6–33.0)
MCHC: 34.3 g/dL (ref 31.5–35.7)
MCV: 89 fL (ref 79–97)
Platelets: 281 10*3/uL (ref 150–379)
RBC: 4.05 x10E6/uL — ABNORMAL LOW (ref 4.14–5.80)
RDW: 13.2 % (ref 12.3–15.4)
WBC: 5.5 10*3/uL (ref 3.4–10.8)

## 2018-01-23 MED ORDER — NITROGLYCERIN 0.4 MG SL SUBL: 0.4000 mg | SUBLINGUAL_TABLET | SUBLINGUAL | 2 refills | Status: DC | PRN

## 2018-01-23 NOTE — Telephone Encounter (Signed)
Left message to go over lab results and recommendations to repeat CBC in 2 weeks.

## 2018-01-23 NOTE — Patient Instructions (Addendum)
Medication Instructions:  1. Your physician recommends that you continue on your current medications as directed. Please refer to the Current Medication list given to you today.   Labwork: TODAY CBC   Testing/Procedures: NONE ORDERED TODAY  Follow-Up: DR. Burt Knack ON April 27, 2018 @ 8 AM   Any Other Special Instructions Will Be Listed Below (If Applicable).     If you need a refill on your cardiac medications before your next appointment, please call your pharmacy.

## 2018-01-23 NOTE — Telephone Encounter (Signed)
-----   Message from Liliane Shi, [place] sent at 01/23/2018  4:32 PM EDT ----- The hemoglobin is a little lower but fairly stable. Repeat CBC in 2 weeks.  Richardson Dopp, PA-C    01/23/2018 4:31 PM

## 2018-01-26 NOTE — Telephone Encounter (Signed)
DPR ok to s/w pt's wife who has been notified of lab results by phone with verbal understanding. Pt's wife is agreeable to repeat cbc to be done in 2 weeks 02/09/18. Pt's wife thanked me for my call today.

## 2018-01-26 NOTE — Telephone Encounter (Signed)
Follow Up: ° ° ° ° ° °Returning your call from Friday. °

## 2018-01-26 NOTE — Telephone Encounter (Signed)
-----   Message from Liliane Shi, [place] sent at 01/23/2018  4:32 PM EDT ----- The hemoglobin is a little lower but fairly stable. Repeat CBC in 2 weeks.  Richardson Dopp, PA-C    01/23/2018 4:31 PM

## 2018-01-26 NOTE — Addendum Note (Signed)
Addended by: Michae Kava on: 01/26/2018 11:35 AM   Modules accepted: Orders

## 2018-01-26 NOTE — Telephone Encounter (Signed)
Left message x2.

## 2018-02-09 ENCOUNTER — Other Ambulatory Visit: Payer: Medicare HMO | Admitting: *Deleted

## 2018-02-09 DIAGNOSIS — I4819 Other persistent atrial fibrillation: Secondary | ICD-10-CM

## 2018-02-09 LAB — CBC
Hematocrit: 35.8 % — ABNORMAL LOW (ref 37.5–51.0)
Hemoglobin: 12.1 g/dL — ABNORMAL LOW (ref 13.0–17.7)
MCH: 30.2 pg (ref 26.6–33.0)
MCHC: 33.8 g/dL (ref 31.5–35.7)
MCV: 89 fL (ref 79–97)
Platelets: 278 10*3/uL (ref 150–379)
RBC: 4.01 x10E6/uL — ABNORMAL LOW (ref 4.14–5.80)
RDW: 14 % (ref 12.3–15.4)
WBC: 5.7 10*3/uL (ref 3.4–10.8)

## 2018-02-10 ENCOUNTER — Telehealth: Payer: Self-pay | Admitting: *Deleted

## 2018-02-10 NOTE — Telephone Encounter (Signed)
DPR ok to lmom HgB is stable. No changes to be made with medications . Any questions please call the office [phone].

## 2018-02-10 NOTE — Telephone Encounter (Signed)
-----   Message from Liliane Shi, [place] sent at 02/09/2018  9:46 PM EDT ----- The hemoglobin is stable.  Continue current medications and follow up as planned.  Richardson Dopp, PA-C    02/09/2018 9:46 PM

## 2018-02-10 NOTE — Telephone Encounter (Signed)
DPR ok to s/w pt's wife who called back and has been notified of lab results by phone with verbal understanding. Pt's wife thanked me for the call.

## 2018-04-27 ENCOUNTER — Encounter: Payer: Self-pay | Admitting: Cardiovascular Disease

## 2018-04-27 ENCOUNTER — Ambulatory Visit: Payer: Medicare HMO | Admitting: Cardiovascular Disease

## 2018-04-27 VITALS — BP 126/84 | HR 65 | Ht 68.0 in | Wt 182.1 lb

## 2018-04-27 DIAGNOSIS — I481 Persistent atrial fibrillation: Secondary | ICD-10-CM

## 2018-04-27 DIAGNOSIS — I4819 Other persistent atrial fibrillation: Secondary | ICD-10-CM

## 2018-04-27 DIAGNOSIS — I2510 Atherosclerotic heart disease of native coronary artery without angina pectoris: Secondary | ICD-10-CM

## 2018-04-27 NOTE — Progress Notes (Signed)
Cardiology Office Note Date:  04/27/2018   ID:  ROMALDO SAVILLE, DOB [birth year], MRN [id number]  PCP:  Christain Sacramento, MD  Cardiologist:  Sherren Mocha, MD    Chief Complaint  Patient presents with  . Coronary Artery Disease     History of Present Illness: Antonio Lawrence is a 72 y.o. male who presents for FU of CAD and paroxysmal atrial fibrillation. He has a hx of inferoposterior MI in 2015 treated with stenting of the LCX/OM. RCA is chronically occluded. Has developed atrial fibrillation in follow-up, failed DCCV, now managed with rate control and antiocoagulation.  He is here alone today. Doing well. Compliant with meds. No bleeding problems. Staying active - minimal shortness of breath with activity. No other symptoms. Today, he denies symptoms of palpitations, chest pain, orthopnea, PND, lower extremity edema, dizziness, or syncope.  Past Medical History:  Diagnosis Date  . Coronary artery disease    Chronic occlusion of the RCA with left-to-right collaterals, PCI of the left circumflex/obtuse marginal, moderate nonobstructive disease of the LAD  . Hyperglycemia   . Hyperlipidemia   . Hypertension   . Lung cancer ([place])   . Myocardial infarction [facility])    Inferoposterior STEMI October 2015, PCI of the first OM branch with a drug-eluting stent    Past Surgical History:  Procedure Laterality Date  . CARDIOVERSION N/A 01/09/2018   Procedure: CARDIOVERSION;  Surgeon: Lelon Perla, MD;  Location: [facility] ENDOSCOPY;  Service: Cardiovascular;  Laterality: N/A;  . COLONOSCOPY WITH PROPOFOL N/A 01/13/2015   Procedure: COLONOSCOPY WITH PROPOFOL;  Surgeon: Beryle Beams, MD;  Location: [facility] ENDOSCOPY;  Service: Endoscopy;  Laterality: N/A;  . ENTEROSCOPY N/A 01/13/2015   Procedure: ENTEROSCOPY with propofol;  Surgeon: Beryle Beams, MD;  Location: [facility] ENDOSCOPY;  Service: Endoscopy;  Laterality: N/A;  . LEFT HEART CATH Bilateral 09/03/2014   Procedure: LEFT HEART CATH;  Surgeon:  Laverda Page, MD;  Location: [facility] CATH LAB;  Service: Cardiovascular;  Laterality: Bilateral;  . LOBECTOMY      Current Outpatient Medications  Medication Sig Dispense Refill  . apixaban (ELIQUIS) 5 MG TABS tablet Take 1 tablet (5 mg total) by mouth 2 (two) times daily. 60 tablet 11  . atorvastatin (LIPITOR) 80 MG tablet TAKE 1 TABLET BY MOUTH ONCE DAILY AT 6 PM 90 tablet 3  . carvedilol (COREG) 25 MG tablet TAKE 1/2 (ONE-HALF) TABLET BY MOUTH TWICE DAILY WITH  A  MEAL 90 tablet 3  . lisinopril-hydrochlorothiazide (PRINZIDE,ZESTORETIC) 20-12.5 MG tablet TAKE 1 TABLET BY MOUTH ONCE DAILY 90 tablet 3  . nitroGLYCERIN (NITROSTAT) 0.4 MG SL tablet Place 1 tablet (0.4 mg total) under the tongue every 5 (five) minutes x 3 doses as needed for chest pain. 25 tablet 2   No current facility-administered medications for this visit.     Allergies:   Patient has no known allergies.   Social History:  The patient  reports that he quit smoking about 14 years ago. His smoking use included cigarettes. He has never used smokeless tobacco. He reports that he does not drink alcohol or use drugs.   Family History:  The patient's family history includes Cancer in his father; Heart attack in his mother.    ROS:  Please see the history of present illness.  Otherwise, review of systems is positive for leg cramps.  All other systems are reviewed and negative.    PHYSICAL EXAM: VS:  BP 126/84   Pulse 65   Ht  5\' 8"  (1.727 m)   Wt 182 lb 1.9 oz (82.6 kg)   SpO2 98%   BMI 27.69 kg/m  , BMI Body mass index is 27.69 kg/m. GEN: Well nourished, well developed, in no acute distress  HEENT: normal  Neck: no JVD, no masses. No carotid bruits Cardiac: RRR with occasional premature beatswithout murmur or gallop                Respiratory:  clear to auscultation bilaterally, normal work of breathing GI: soft, nontender, nondistended, + BS MS: no deformity or atrophy  Ext: no pretibial edema, pedal pulses 2+=  bilaterally Skin: warm and dry, no rash Neuro:  Strength and sensation are intact Psych: euthymic mood, full affect  EKG:  EKG is not ordered today.  Recent Labs: 11/24/2017: ALT 20 01/02/2018: BUN 23; Creatinine, Ser 1.32; Potassium 4.7; Sodium 139 02/09/2018: Hemoglobin 12.1; Platelets 278   Lipid Panel     Component Value Date/Time   CHOL 154 11/24/2017 1123   TRIG 119 11/24/2017 1123   HDL 44 11/24/2017 1123   CHOLHDL 3.5 11/24/2017 1123   CHOLHDL 3.3 12/01/2015 0957   VLDL 23 12/01/2015 0957   LDLCALC 86 11/24/2017 1123      Wt Readings from Last 3 Encounters:  04/27/18 182 lb 1.9 oz (82.6 kg)  01/23/18 184 lb 12.8 oz (83.8 kg)  01/09/18 183 lb (83 kg)     Cardiac Studies Reviewed: Echo 12-01-2017: Left ventricle:  The cavity size was normal. Wall thickness was increased in a pattern of mild LVH. Systolic function was normal. The estimated ejection fraction was in the range of 55% to 60%.  ------------------------------------------------------------------- Aortic valve:   Mildly thickened leaflets.  Doppler:  There was no regurgitation.  ------------------------------------------------------------------- Mitral valve:   Mildly thickened leaflets .  Doppler:  There was mild regurgitation.    Valve area by pressure half-time: 4.89 cm^2. Indexed valve area by pressure half-time: 2.44 cm^2/m^2.    Peak gradient (D): 4 mm Hg.  ------------------------------------------------------------------- Left atrium:  The atrium was normal in size.  ------------------------------------------------------------------- Right ventricle:  The cavity size was normal. Wall thickness was normal. Systolic function was normal.  ------------------------------------------------------------------- Pulmonic valve:   Poorly visualized.  Doppler:  There was no significant regurgitation.  ------------------------------------------------------------------- Tricuspid valve:    Structurally normal valve.   Leaflet separation was normal.  Doppler:  Transvalvular velocity was within the normal range. There was no regurgitation.  ------------------------------------------------------------------- Right atrium:  The atrium was normal in size.  ------------------------------------------------------------------- Pericardium:  There was no pericardial effusion.  ------------------------------------------------------------------- Systemic veins: Inferior vena cava: The vessel was normal in size. The respirophasic diameter changes were in the normal range (= 50%), consistent with normal central venous pressure. Diameter: 14 mm.   ASSESSMENT AND PLAN: 1.  Paroxysmal atrial fibrillation: tolerating apixaban without bleeding problems. Most recent CBC reviewed. May be in sinus today based on exam findings. Has failed DCCV in past. Continue same Rx. No symptoms at present.   2. CAD, native vessel, without angina: no antiplatelet Rx on chronic OAC. Continue beta blocker, statin.  3. Hyperlipidemia: treated with high intensity statin drug.   4. HTN: BP controlled on carvedilol, lisinopril, HCT.  5. Hx GI bleed: stable, FU CBC 6 months at the time of clinical FU.  Current medicines are reviewed with the patient today.  The patient does not have concerns regarding medicines.  Labs/ tests ordered today include:  No orders of the defined types were placed in this encounter.  Disposition:   FU 6 months  Signed, Sherren Mocha, MD  04/27/2018 8:21 AM    [facility] [address], [place], [place]  [address] Phone: [phone]; Fax: [phone]

## 2018-04-27 NOTE — Patient Instructions (Signed)
Medication Instructions:  Your provider recommends that you continue on your current medications as directed. Please refer to the Current Medication list given to you today.    Labwork: Your provider recommends that you have fasting lab work when you have your appointment in 6 months.    Testing/Procedures: None  Follow-Up: Your provider wants you to follow-up in: 6 months with Richardson Dopp, PA. You will receive a reminder letter in the mail two months in advance. If you don't receive a letter, please call our office to schedule the follow-up appointment.    Any Other Special Instructions Will Be Listed Below (If Applicable).     If you need a refill on your cardiac medications before your next appointment, please call your pharmacy.

## 2018-10-30 ENCOUNTER — Ambulatory Visit: Payer: Medicare HMO | Admitting: Physician Assistant

## 2018-10-30 ENCOUNTER — Encounter: Payer: Self-pay | Admitting: Physician Assistant

## 2018-10-30 VITALS — BP 140/70 | HR 54 | Ht 68.0 in | Wt 186.0 lb

## 2018-10-30 DIAGNOSIS — I4819 Other persistent atrial fibrillation: Secondary | ICD-10-CM | POA: Diagnosis not present

## 2018-10-30 DIAGNOSIS — I2510 Atherosclerotic heart disease of native coronary artery without angina pectoris: Secondary | ICD-10-CM

## 2018-10-30 DIAGNOSIS — I10 Essential (primary) hypertension: Secondary | ICD-10-CM | POA: Diagnosis not present

## 2018-10-30 NOTE — Patient Instructions (Addendum)
Medication Instructions:  No changes.   If you need a refill on your cardiac medications before your next appointment, please call your pharmacy.   Lab work: Today - BMET, CBC  If you have labs (blood work) drawn today and your tests are completely normal, you will receive your results only by: Marland Kitchen MyChart Message (if you have MyChart) OR . A paper copy in the mail If you have any lab test that is abnormal or we need to change your treatment, we will call you to review the results.  Testing/Procedures: None   Follow-Up: At [facility] you and your health needs are our priority.  As part of our continuing mission to provide you with exceptional heart care, we have created designated Provider Care Teams.  These Care Teams include your primary Cardiologist (physician) and Advanced Practice Providers (APPs -  Physician Assistants and Nurse Practitioners) who all work together to provide you with the care you need, when you need it. You will need a follow up appointment in:  6 months with Sherren Mocha, MD   Any Other Special Instructions Will Be Listed Below (If Applicable). Keep a check on your blood pressure.  If they are consistently > 130/80, call.

## 2018-10-30 NOTE — Progress Notes (Signed)
Cardiology Office Note:    Date:  10/30/2018   ID:  Antonio ESTIS, DOB Mar 31, 1946, MRN [id number]  PCP:  Christain Sacramento, MD  Cardiologist:  Sherren Mocha, MD   Electrophysiologist:  None   Referring MD: Christain Sacramento, MD   Chief Complaint  Patient presents with  . Follow-up    CAD, AFib    History of Present Illness:    Antonio Lawrence is a 72 y.o. male with coronary artery disease status post inferoposterior myocardial infarction in October 2015 treated with a drug-eluting stent to the OM1, persistent atrial fibrillation status post cardioversion in March 2019, hypertension, hyperlipidemia, prior GI bleeding.  He underwent cardioversion in March 2019 but had return of atrial fibrillation.  He was felt to be asymptomatic and therefore a rate control strategy was chosen.  He was last seen by Dr. Burt Knack in June 2019.   Antonio Lawrence returns for follow-up.  He denies chest pain, shortness of breath, syncope, orthopnea, PND or significant pedal edema.  Prior CV studies:   The following studies were reviewed today:  Echo 12/01/17:  Mild LVH, EF 55-60, mild MR  Echo 11/25/14:  Mild LVH, EF 55-60%, normal wall motion, grade 1 diastolic dysfunction, mildly dilated ascending aorta at 42 mm, aortic root 40 mm, mild LAE, normal RVSF, mild RAE, mild TR, PASP 33 mmHg  LHC 09/03/14:  LVEF of 50-55% inferior lateral wall moderate hypokinesis. RCA mid occluded, L-R collaterals; LM 20-30%; LCx with OM1 proximal 99%; LAD mid intramyocardial bridging, mid 40%,distal 70-80%;  PCI: Successful PTCA and stenting of the Proximal OM1 with 2.5 x 18 mm Xience Alpine DES.  Past Medical History:  Diagnosis Date  . Coronary artery disease    Chronic occlusion of the RCA with left-to-right collaterals, PCI of the left circumflex/obtuse marginal, moderate nonobstructive disease of the LAD  . Hyperglycemia   . Hyperlipidemia   . Hypertension   . Lung cancer ([place])   . Myocardial infarction [facility])      Inferoposterior STEMI October 2015, PCI of the first OM branch with a drug-eluting stent   Surgical Hx: The patient  has a past surgical history that includes Lobectomy; left heart cath (Bilateral, 09/03/2014); Colonoscopy with propofol (N/A, 01/13/2015); enteroscopy (N/A, 01/13/2015); and Cardioversion (N/A, 01/09/2018).   Current Medications: Current Meds  Medication Sig  . apixaban (ELIQUIS) 5 MG TABS tablet Take 1 tablet (5 mg total) by mouth 2 (two) times daily.  Marland Kitchen atorvastatin (LIPITOR) 80 MG tablet TAKE 1 TABLET BY MOUTH ONCE DAILY AT 6 PM  . carvedilol (COREG) 25 MG tablet TAKE 1/2 (ONE-HALF) TABLET BY MOUTH TWICE DAILY WITH  A  MEAL  . lisinopril-hydrochlorothiazide (PRINZIDE,ZESTORETIC) 20-12.5 MG tablet TAKE 1 TABLET BY MOUTH ONCE DAILY  . Multiple Vitamin (MULTI-VITAMIN DAILY PO) Take 1 tablet by mouth daily.  . nitroGLYCERIN (NITROSTAT) 0.4 MG SL tablet Place 1 tablet (0.4 mg total) under the tongue every 5 (five) minutes x 3 doses as needed for chest pain.     Allergies:   Patient has no known allergies.   Social History   Tobacco Use  . Smoking status: Former Smoker    Types: Cigarettes    Last attempt to quit: 12/26/2003    Years since quitting: 14.8  . Smokeless tobacco: Never Used  Substance Use Topics  . Alcohol use: No  . Drug use: No     Family Hx: The patient's family history includes Cancer in his father; Heart attack in his mother.  ROS:   Please see the history of present illness.    ROS All other systems reviewed and are negative.   EKGs/Labs/Other Test Reviewed:    EKG:  EKG is  ordered today.  The ekg ordered today demonstrates atrial fibrillation HR 54  Recent Labs: 11/24/2017: ALT 20 01/02/2018: BUN 23; Creatinine, Ser 1.32; Potassium 4.7; Sodium 139 02/09/2018: Hemoglobin 12.1; Platelets 278   Recent Lipid Panel Lab Results  Component Value Date/Time   CHOL 154 11/24/2017 11:23 AM   TRIG 119 11/24/2017 11:23 AM   HDL 44 11/24/2017 11:23 AM    CHOLHDL 3.5 11/24/2017 11:23 AM   CHOLHDL 3.3 12/01/2015 09:57 AM   LDLCALC 86 11/24/2017 11:23 AM    Physical Exam:    VS:  BP 140/70   Pulse (!) 54   Ht 5\' 8"  (1.727 m)   Wt 186 lb (84.4 kg)   SpO2 97%   BMI 28.28 kg/m     Wt Readings from Last 3 Encounters:  10/30/18 186 lb (84.4 kg)  04/27/18 182 lb 1.9 oz (82.6 kg)  01/23/18 184 lb 12.8 oz (83.8 kg)     Physical Exam  Constitutional: He is oriented to person, place, and time. He appears well-developed and well-nourished. No distress.  HENT:  Head: Normocephalic and atraumatic.  Neck: Neck supple. No JVD present.  Cardiovascular: Normal rate, S1 normal and S2 normal. An irregularly irregular rhythm present.  No murmur heard. Pulmonary/Chest: Breath sounds normal. He has no rales.  Abdominal: Soft. There is no hepatomegaly.  Musculoskeletal:        General: No edema.  Neurological: He is alert and oriented to person, place, and time.  Skin: Skin is warm and dry.    ASSESSMENT & PLAN:    Persistent atrial fibrillation He is asymptomatic.  We are managing him with a rate control strategy.  He is currently tolerating anticoagulation with Apixaban.  Continue current dose of carvedilol, Apixaban.  Arrange follow-up BMET, CBC.  Coronary artery disease involving native coronary artery of native heart without angina pectoris History of MI in 2015 treated with a drug-eluting stent to the OM1.  He has a chronically occluded RCA.  He is not having angina.  He is not on aspirin as he is on Apixaban.  Continue atorvastatin, beta-blocker, lisinopril.  Essential hypertension Blood pressure above target.  I have asked him to continue to monitor this and notify us if his blood pressures remain >130/80.  Consider increasing HCTZ component of his lisinopril to 25 mg if his blood pressure remains above target.   Dispo:  Return in about 6 months (around 05/01/2019) for Routine Follow Up, w/ Dr. Burt Knack.   Medication Adjustments/Labs  and Tests Ordered: Current medicines are reviewed at length with the patient today.  Concerns regarding medicines are outlined above.  Tests Ordered: Orders Placed This Encounter  Procedures  . Basic metabolic panel  . CBC  . EKG 12-Lead   Medication Changes: No orders of the defined types were placed in this encounter.   Signed, Richardson Dopp, PA-C  10/30/2018 2:02 PM    [facility] [address], [place], [place]  [address] Phone: [phone]; Fax: [phone]

## 2018-10-31 LAB — BASIC METABOLIC PANEL
BUN/Creatinine Ratio: 13 (ref 10–24)
BUN: 17 mg/dL (ref 8–27)
CO2: 25 mmol/L (ref 20–29)
Calcium: 9.6 mg/dL (ref 8.6–10.2)
Chloride: 101 mmol/L (ref 96–106)
Creatinine, Ser: 1.26 mg/dL (ref 0.76–1.27)
GFR calc Af Amer: 65 mL/min/{1.73_m2} (ref >59)
GFR calc non Af Amer: 57 mL/min/{1.73_m2} — ABNORMAL LOW (ref >59)
Glucose: 95 mg/dL (ref 65–99)
Potassium: 4.8 mmol/L (ref 3.5–5.2)
Sodium: 138 mmol/L (ref 134–144)

## 2018-10-31 LAB — CBC
Hematocrit: 38.5 % (ref 37.5–51.0)
Hemoglobin: 13 g/dL (ref 13.0–17.7)
MCH: 30.8 pg (ref 26.6–33.0)
MCHC: 33.8 g/dL (ref 31.5–35.7)
MCV: 91 fL (ref 79–97)
Platelets: 316 10*3/uL (ref 150–450)
RBC: 4.22 x10E6/uL (ref 4.14–5.80)
RDW: 13 % (ref 12.3–15.4)
WBC: 7.9 10*3/uL (ref 3.4–10.8)

## 2018-11-02 ENCOUNTER — Telehealth: Payer: Self-pay | Admitting: Physician Assistant

## 2018-11-02 NOTE — Telephone Encounter (Signed)
New Message   Pt calling to check on the status of pt's lab results. Please call

## 2018-11-02 NOTE — Telephone Encounter (Signed)
The patient's wife has been notified of the result and verbalized understanding.  She will inform patient.  All questions (if any) were answered. Frederik Schmidt, RN 11/02/2018 11:10 AM

## 2018-11-23 ENCOUNTER — Other Ambulatory Visit: Payer: Self-pay | Admitting: Cardiovascular Disease

## 2018-11-23 NOTE — Telephone Encounter (Signed)
Pt last saw Richardson Dopp, [place] on 10/30/18, last labs 10/30/18 Creat 1.26, age 73, weight 84.4kg, based on specified criteria pt is on appropriate dosage of Eliquis 5mg  BID.  Will refill rx.

## 2018-12-16 ENCOUNTER — Other Ambulatory Visit: Payer: Self-pay | Admitting: Physician Assistant

## 2018-12-16 DIAGNOSIS — I10 Essential (primary) hypertension: Secondary | ICD-10-CM

## 2018-12-22 ENCOUNTER — Other Ambulatory Visit: Payer: Self-pay | Admitting: Cardiovascular Disease

## 2018-12-22 DIAGNOSIS — E785 Hyperlipidemia, unspecified: Secondary | ICD-10-CM

## 2018-12-22 DIAGNOSIS — I10 Essential (primary) hypertension: Secondary | ICD-10-CM

## 2019-01-20 ENCOUNTER — Other Ambulatory Visit: Payer: Self-pay | Admitting: Cardiovascular Disease

## 2019-02-15 ENCOUNTER — Telehealth: Payer: Self-pay | Admitting: Cardiovascular Disease

## 2019-02-15 NOTE — Telephone Encounter (Signed)
lmtcb

## 2019-02-15 NOTE — Telephone Encounter (Signed)
Follow up:    Patient wife returning call back.

## 2019-02-15 NOTE — Telephone Encounter (Signed)
Wife calling in giggling about the question she is about to ask....... she states that pt had a heart attack several years ago and "I know it is a little late to ask, but is it ok that we have sex"? States that her husband couldn't believe she was calling in to ask this question considering they have had "relations" since his heart attack.   Reports pt doing well with no heart complaints.   Advised that pt may continue "relations" with wife.  Advised if pt begins experiencing any chest discomfort during activity to stop activity. Will forward to Dr. Burt Knack for any different advisement, if any

## 2019-02-15 NOTE — Telephone Encounter (Signed)
° ° °  Patient's spouse calling , states she needs to discuss a medication. She said she doesn't know the name of medication.  Scheduler asked if it was for a refill or med issue. Spouse advised scheduler "stop asking questions" and have the nurse call

## 2019-04-05 ENCOUNTER — Other Ambulatory Visit: Payer: Self-pay | Admitting: Cardiovascular Disease

## 2019-04-05 DIAGNOSIS — E785 Hyperlipidemia, unspecified: Secondary | ICD-10-CM

## 2019-04-05 DIAGNOSIS — I10 Essential (primary) hypertension: Secondary | ICD-10-CM

## 2019-04-07 ENCOUNTER — Other Ambulatory Visit: Payer: Self-pay | Admitting: Cardiovascular Disease

## 2019-04-07 DIAGNOSIS — E785 Hyperlipidemia, unspecified: Secondary | ICD-10-CM

## 2019-04-07 DIAGNOSIS — I10 Essential (primary) hypertension: Secondary | ICD-10-CM

## 2019-04-07 MED ORDER — CARVEDILOL 25 MG PO TABS: ORAL_TABLET | ORAL | 2 refills | Status: DC

## 2019-04-07 NOTE — Telephone Encounter (Signed)
°*  STAT* If patient is at the pharmacy, call can be transferred to refill team.   1. Which medications need to be refilled? (please list name of each medication and dose if known) Carvedilol 25 mg  2. Which pharmacy/location (including street and city if local pharmacy) is medication to be sent to? [facility]   3. Do they need a 30 day or 90 day supply? [place]

## 2019-04-07 NOTE — Telephone Encounter (Signed)
Pt's medication was sent to pt's pharmacy as requested. Confirmation received.  °

## 2019-04-27 ENCOUNTER — Telehealth: Payer: Self-pay | Admitting: Cardiovascular Disease

## 2019-04-27 NOTE — Telephone Encounter (Signed)
Attempted to contact pt to screen for COVID virus prior to in person appt tomorrow.  Left message advising to call back today before 5pm or tomorrow morning.

## 2019-04-27 NOTE — Telephone Encounter (Signed)
   Spoke with wife went over questions.   COVID-19 Pre-Screening Questions:  . In the past 7 to 10 days have you had a cough,  shortness of breath, headache, congestion, fever (100 or greater) body aches, chills, sore throat, or sudden loss of taste or sense of smell? No . Have you been around anyone with known Covid 19. . Have you been around anyone who is awaiting Covid 19 test results in the past 7 to 10 days? No . Have you been around anyone who has been exposed to Covid 19, or has mentioned symptoms of Covid 19 within the past 7 to 10 days? No  If you have any concerns/questions about symptoms patients report during screening (either on the phone or at threshold). Contact the provider seeing the patient or DOD for further guidance.  If neither are available contact a member of the leadership team.     Advised wife to review these questions with pt once he returns home and call us in the morning if he has had any of the sx or recent exposure to someone, otherwise, ok to come to appt. Wife verbalized understanding.

## 2019-04-28 ENCOUNTER — Other Ambulatory Visit: Payer: Self-pay

## 2019-04-28 ENCOUNTER — Encounter: Payer: Self-pay | Admitting: Cardiovascular Disease

## 2019-04-28 ENCOUNTER — Ambulatory Visit (INDEPENDENT_AMBULATORY_CARE_PROVIDER_SITE_OTHER): Payer: Medicare HMO | Admitting: Cardiovascular Disease

## 2019-04-28 VITALS — BP 120/64 | HR 60 | Ht 68.0 in | Wt 183.0 lb

## 2019-04-28 DIAGNOSIS — I2510 Atherosclerotic heart disease of native coronary artery without angina pectoris: Secondary | ICD-10-CM

## 2019-04-28 DIAGNOSIS — I4819 Other persistent atrial fibrillation: Secondary | ICD-10-CM

## 2019-04-28 DIAGNOSIS — E782 Mixed hyperlipidemia: Secondary | ICD-10-CM

## 2019-04-28 MED ORDER — ATORVASTATIN CALCIUM 80 MG PO TABS: 80.0000 mg | ORAL_TABLET | Freq: Every day | ORAL | 3 refills | Status: DC

## 2019-04-28 MED ORDER — SILDENAFIL CITRATE 50 MG PO TABS: 50.0000 mg | ORAL_TABLET | Freq: Every day | ORAL | 0 refills | Status: DC | PRN

## 2019-04-28 NOTE — Patient Instructions (Signed)
Medication Instructions:  1) START SILDENAFIL 50 mg once daily as needed prior to sexual activity. Do not take within 24 hours of taking nitroglycerin.    Follow-Up: Your provider wants you to follow-up in: 6 months with Antonio Lawrence. You will receive a reminder letter in the mail two months in advance. If you don't receive a letter, please call our office to schedule the follow-up appointment.

## 2019-04-28 NOTE — Progress Notes (Signed)
Cardiology Office Note:    Date:  04/28/2019   ID:  Antonio Lawrence, DOB 02-14-1946, MRN [id number]  PCP:  Christain Sacramento, MD  Cardiologist:  Sherren Mocha, MD  Electrophysiologist:  None   Referring MD: Christain Sacramento, MD   Chief Complaint  Patient presents with  . Coronary Artery Disease    History of Present Illness:    Antonio Lawrence is a 73 y.o. male with a hx of Persistent atrial fibrillation and coronary artery disease, presenting for follow-up evaluation.  The patient initially presented with an inferoposterior MI in 2015 treated with a drug-eluting stent to the first OM branch of the circumflex.  He has undergone cardioversion for persistent atrial fibrillation but had recurrent A. fib and was felt to be best managed with a strategy of rate control and anticoagulation.  He has a history of GI bleeding in the past but he has tolerated apixaban over recent years.  He is tentatively planning on having a surveillance colonoscopy in the near future.  The patient is here alone today.  He is doing well from a cardiac perspective.  He denies chest pain, chest pressure, or shortness of breath.  He has had no recent heart palpitations, lightheadedness, or syncope.  He complains of erectile dysfunction and requests a prescription for ED today.  He does not use nitroglycerin.  He is counseled regarding the contraindication of ED medications and nitroglycerin use within 24 hours.  Past Medical History:  Diagnosis Date  . Coronary artery disease    Chronic occlusion of the RCA with left-to-right collaterals, PCI of the left circumflex/obtuse marginal, moderate nonobstructive disease of the LAD  . Hyperglycemia   . Hyperlipidemia   . Hypertension   . Lung cancer ([place])   . Myocardial infarction [facility])    Inferoposterior STEMI October 2015, PCI of the first OM branch with a drug-eluting stent    Past Surgical History:  Procedure Laterality Date  . CARDIOVERSION N/A 01/09/2018    Procedure: CARDIOVERSION;  Surgeon: Lelon Perla, MD;  Location: [facility] ENDOSCOPY;  Service: Cardiovascular;  Laterality: N/A;  . COLONOSCOPY WITH PROPOFOL N/A 01/13/2015   Procedure: COLONOSCOPY WITH PROPOFOL;  Surgeon: Beryle Beams, MD;  Location: [facility] ENDOSCOPY;  Service: Endoscopy;  Laterality: N/A;  . ENTEROSCOPY N/A 01/13/2015   Procedure: ENTEROSCOPY with propofol;  Surgeon: Beryle Beams, MD;  Location: [facility] ENDOSCOPY;  Service: Endoscopy;  Laterality: N/A;  . LEFT HEART CATH Bilateral 09/03/2014   Procedure: LEFT HEART CATH;  Surgeon: Laverda Page, MD;  Location: [facility] CATH LAB;  Service: Cardiovascular;  Laterality: Bilateral;  . LOBECTOMY      Current Medications: Current Meds  Medication Sig  . atorvastatin (LIPITOR) 80 MG tablet Take 1 tablet (80 mg total) by mouth daily at 6 PM.  . carvedilol (COREG) 25 MG tablet TAKE 1/2 (ONE-HALF) TABLET BY MOUTH TWICE DAILY WITH A MEAL  . ELIQUIS 5 MG TABS tablet TAKE 1 TABLET BY MOUTH TWICE DAILY  . lisinopril-hydrochlorothiazide (PRINZIDE,ZESTORETIC) 20-12.5 MG tablet TAKE 1 TABLET BY MOUTH ONCE DAILY  . Multiple Vitamin (MULTI-VITAMIN DAILY PO) Take 1 tablet by mouth daily.  . nitroGLYCERIN (NITROSTAT) 0.4 MG SL tablet Place 1 tablet (0.4 mg total) under the tongue every 5 (five) minutes x 3 doses as needed for chest pain.  . [DISCONTINUED] atorvastatin (LIPITOR) 80 MG tablet Take 1 tablet (80 mg total) by mouth daily at 6 PM. Pt needs to keep appt in June for further refills  Allergies:   Patient has no known allergies.   Social History   Socioeconomic History  . Marital status: Married    Spouse name: Not on file  . Number of children: Not on file  . Years of education: Not on file  . Highest education level: Not on file  Occupational History  . Not on file  Social Needs  . Financial resource strain: Not on file  . Food insecurity    Worry: Not on file    Inability: Not on file  . Transportation needs    Medical: Not on  file    Non-medical: Not on file  Tobacco Use  . Smoking status: Former Smoker    Types: Cigarettes    Quit date: 12/26/2003    Years since quitting: 15.3  . Smokeless tobacco: Never Used  Substance and Sexual Activity  . Alcohol use: No  . Drug use: No  . Sexual activity: Yes  Lifestyle  . Physical activity    Days per week: Not on file    Minutes per session: Not on file  . Stress: Not on file  Relationships  . Social Herbalist on phone: Not on file    Gets together: Not on file    Attends religious service: Not on file    Active member of club or organization: Not on file    Attends meetings of clubs or organizations: Not on file    Relationship status: Not on file  Other Topics Concern  . Not on file  Social History Narrative   The patient is married. He is a former smoker but quit in 2001 when he was diagnosed with lung cancer.    Family History: The patient's family history includes Cancer in his father; Heart attack in his mother.  ROS:   Please see the history of present illness.    All other systems reviewed and are negative.  EKGs/Labs/Other Studies Reviewed:    The following studies were reviewed today: 2D echocardiogram 12/01/2017: Left ventricle:  The cavity size was normal. Wall thickness was increased in a pattern of mild LVH. Systolic function was normal. The estimated ejection fraction was in the range of 55% to 60%.  ------------------------------------------------------------------- Aortic valve:   Mildly thickened leaflets.  Doppler:  There was no regurgitation.  ------------------------------------------------------------------- Mitral valve:   Mildly thickened leaflets .  Doppler:  There was mild regurgitation.    Valve area by pressure half-time: 4.89 cm^2. Indexed valve area by pressure half-time: 2.44 cm^2/m^2.    Peak gradient (D): 4 mm Hg.  ------------------------------------------------------------------- Left atrium:   The atrium was normal in size.  ------------------------------------------------------------------- Right ventricle:  The cavity size was normal. Wall thickness was normal. Systolic function was normal.  ------------------------------------------------------------------- Pulmonic valve:   Poorly visualized.  Doppler:  There was no significant regurgitation.  ------------------------------------------------------------------- Tricuspid valve:   Structurally normal valve.   Leaflet separation was normal.  Doppler:  Transvalvular velocity was within the normal range. There was no regurgitation.  ------------------------------------------------------------------- Right atrium:  The atrium was normal in size.  ------------------------------------------------------------------- Pericardium:  There was no pericardial effusion.  ------------------------------------------------------------------- Systemic veins: Inferior vena cava: The vessel was normal in size. The respirophasic diameter changes were in the normal range (= 50%), consistent with normal central venous pressure. Diameter: 14 mm.  EKG:  EKG is not ordered today.    Recent Labs: 10/30/2018: BUN 17; Creatinine, Ser 1.26; Hemoglobin 13.0; Platelets 316; Potassium 4.8; Sodium 138  Recent Lipid Panel  Component Value Date/Time   CHOL 154 11/24/2017 1123   TRIG 119 11/24/2017 1123   HDL 44 11/24/2017 1123   CHOLHDL 3.5 11/24/2017 1123   CHOLHDL 3.3 12/01/2015 0957   VLDL 23 12/01/2015 0957   LDLCALC 86 11/24/2017 1123    Physical Exam:    VS:  BP 120/64   Pulse 60   Ht 5\' 8"  (1.727 m)   Wt 183 lb (83 kg)   SpO2 97%   BMI 27.83 kg/m     Wt Readings from Last 3 Encounters:  04/28/19 183 lb (83 kg)  10/30/18 186 lb (84.4 kg)  04/27/18 182 lb 1.9 oz (82.6 kg)     GEN:  Well nourished, well developed in no acute distress HEENT: Normal NECK: No JVD; No carotid bruits LYMPHATICS: No  lymphadenopathy CARDIAC: Irregularly irregular, no murmurs, rubs, gallops RESPIRATORY:  Clear to auscultation without rales, wheezing or rhonchi  ABDOMEN: Soft, non-tender, non-distended MUSCULOSKELETAL:  No edema; No deformity  SKIN: Warm and dry NEUROLOGIC:  Alert and oriented x 3 PSYCHIATRIC:  Normal affect   ASSESSMENT:    1. Persistent atrial fibrillation   2. Coronary artery disease involving native coronary artery of native heart without angina pectoris   3. Mixed hyperlipidemia    PLAN:    In order of problems listed above:  1. The patient appears stable with good heart rate control.  His last echocardiogram demonstrated normal LV function.  He is anticoagulated with apixaban without bleeding problems.  He does have an upcoming colonoscopy planned.  No changes are made today. 2. Stable on a combination of carvedilol and atorvastatin.  No antiplatelet therapy as prescribed because of chronic oral anticoagulation. 3. Last lipids reviewed from January 2019.  He is treated with a high intensity statin drug.  Will update lipids and LFTs.   Medication Adjustments/Labs and Tests Ordered: Current medicines are reviewed at length with the patient today.  Concerns regarding medicines are outlined above.  Orders Placed This Encounter  Procedures  . Hepatic function panel  . Lipid panel   Meds ordered this encounter  Medications  . atorvastatin (LIPITOR) 80 MG tablet    Sig: Take 1 tablet (80 mg total) by mouth daily at 6 PM.    Dispense:  90 tablet    Refill:  3    Pt nees to keep appt in June for further refills  . sildenafil (VIAGRA) 50 MG tablet    Sig: Take 1 tablet (50 mg total) by mouth daily as needed for erectile dysfunction.    Dispense:  10 tablet    Refill:  0    Patient Instructions  Medication Instructions:  1) START SILDENAFIL 50 mg once daily as needed prior to sexual activity. Do not take within 24 hours of taking nitroglycerin.    Follow-Up: Your  provider wants you to follow-up in: 6 months with Richardson Dopp. You will receive a reminder letter in the mail two months in advance. If you don't receive a letter, please call our office to schedule the follow-up appointment.      Signed, Sherren Mocha, MD  04/28/2019 5:48 PM    [place]

## 2019-04-29 ENCOUNTER — Telehealth: Payer: Self-pay | Admitting: Cardiovascular Disease

## 2019-04-29 MED ORDER — SILDENAFIL CITRATE 100 MG PO TABS: ORAL_TABLET | ORAL | 2 refills | Status: DC

## 2019-04-29 NOTE — Telephone Encounter (Signed)
The patient's wife (DPR) states sildenafil is almost $300.  Will call in to [facility].

## 2019-04-29 NOTE — Telephone Encounter (Signed)
New message   Patients wife would like to be contacted to discuss a medication that the pharmacy can't get in. The patient's wife does not remember the name of the medication. Please call to discuss.

## 2019-04-30 ENCOUNTER — Telehealth: Payer: Self-pay | Admitting: *Deleted

## 2019-04-30 NOTE — Telephone Encounter (Signed)
    COVID-19 Pre-Screening Questions:  . In the past 7 to 10 days have you had a cough,  shortness of breath, headache, congestion, fever (100 or greater) body aches, chills, sore throat, or sudden loss of taste or sense of smell? . Have you been around anyone with known Covid 19. . Have you been around anyone who is awaiting Covid 19 test results in the past 7 to 10 days? . Have you been around anyone who has been exposed to Covid 19, or has mentioned symptoms of Covid 19 within the past 7 to 10 days?  If you have any concerns/questions about symptoms patients report during screening (either on the phone or at threshold). Contact the provider seeing the patient or DOD for further guidance.  If neither are available contact a member of the leadership team.           Contacted patient via telephone call. All no to Covid 19 questions . Has a mask.KB

## 2019-05-03 ENCOUNTER — Other Ambulatory Visit: Payer: Self-pay

## 2019-05-03 ENCOUNTER — Other Ambulatory Visit: Payer: Medicare HMO | Admitting: *Deleted

## 2019-05-03 DIAGNOSIS — I2510 Atherosclerotic heart disease of native coronary artery without angina pectoris: Secondary | ICD-10-CM

## 2019-05-03 DIAGNOSIS — E782 Mixed hyperlipidemia: Secondary | ICD-10-CM

## 2019-05-03 LAB — HEPATIC FUNCTION PANEL
ALT: 22 IU/L (ref 0–44)
AST: 19 IU/L (ref 0–40)
Albumin: 3.9 g/dL (ref 3.7–4.7)
Alkaline Phosphatase: 80 IU/L (ref 39–117)
Bilirubin Total: 0.5 mg/dL (ref 0.0–1.2)
Bilirubin, Direct: 0.16 mg/dL (ref 0.00–0.40)
Total Protein: 6.5 g/dL (ref 6.0–8.5)

## 2019-05-03 LAB — LIPID PANEL
Chol/HDL Ratio: 2.9 ratio (ref 0.0–5.0)
Cholesterol, Total: 124 mg/dL (ref 100–199)
HDL: 43 mg/dL (ref >39)
LDL Calculated: 64 mg/dL (ref 0–99)
Triglycerides: 86 mg/dL (ref 0–149)
VLDL Cholesterol Cal: 17 mg/dL (ref 5–40)

## 2019-05-17 ENCOUNTER — Telehealth: Payer: Self-pay | Admitting: Cardiovascular Disease

## 2019-05-17 DIAGNOSIS — E782 Mixed hyperlipidemia: Secondary | ICD-10-CM

## 2019-05-17 NOTE — Telephone Encounter (Signed)
Called to instructed patient to START CRESTOR 10 mg daily. Will schedule LFTs and lipids in 3 months. Lipitor removed from med list.   Left message to call back.

## 2019-05-17 NOTE — Telephone Encounter (Signed)
Please try crestor 10 mg daily. thx

## 2019-05-17 NOTE — Telephone Encounter (Signed)
Antonio Lawrence has been on a 2 week holiday from Lipitor.  His wife states he feels much better since stopping the medication.  He has more energy and has only had mild cramping 3 times since he stopped the med. To her knowledge, Antonio Lawrence has not tried any other statins but is willing.  She understands she will be called with Dr. Antionette Char recommendations.

## 2019-05-17 NOTE — Telephone Encounter (Signed)
New message   Patient's would like a call back in reference to lipitor. Please call.

## 2019-05-19 MED ORDER — ROSUVASTATIN CALCIUM 10 MG PO TABS: 10.0000 mg | ORAL_TABLET | Freq: Every day | ORAL | 3 refills | Status: DC

## 2019-05-19 NOTE — Telephone Encounter (Signed)
Instructed patient's wife to have him START CRESTOR 10 mg daily. FLP and LFTs scheduled October 12.  She was grateful for call and agrees with treatment plan.

## 2019-05-19 NOTE — Telephone Encounter (Signed)
  Wife is returning call 

## 2019-05-24 ENCOUNTER — Telehealth: Payer: Self-pay | Admitting: Cardiovascular Disease

## 2019-05-24 NOTE — Telephone Encounter (Signed)
Encounter not needed

## 2019-05-24 NOTE — Telephone Encounter (Signed)
° ° °  Pt c/o medication issue:  1. Name of Medication: sildenafil (VIAGRA) 100 MG tablet  2. How are you currently taking this medication (dosage and times per day)? As prescribed  3. Are you having a reaction (difficulty breathing--STAT)? no  4. What is your medication issue? Would not give me the specifics. She just asked that [name], Dr. Antionette Char nurse call the wife to go over the details of the medication

## 2019-05-24 NOTE — Telephone Encounter (Signed)
Spoke with the wife, she agreed to try the 100 mg dose.

## 2019-05-24 NOTE — Telephone Encounter (Signed)
lpmtcb 7/13

## 2019-05-24 NOTE — Telephone Encounter (Signed)
[name] and Dr. Burt Knack are out of town for the week. He can try taking a 100mg  to see if that works better.

## 2019-05-24 NOTE — Telephone Encounter (Signed)
Spoke with the wife, she stated they tried Viagra, however, he took the medication on Saturday at 7:30 pm and never got an erection and he woke up on Sunday around 7 am with an exertion. She wanted to know what they could do since the medication did not work. The patient only took the 50 mg tablet this first time.

## 2019-05-24 NOTE — Telephone Encounter (Signed)
I spoke to the patient's wife (Diane) who really needs to speak to [facility] about the patient's medications.  She is not comfortable speaking to anyone, but [place]  I informed her that [name]'s off, but I could try to be of assistance.  She said that she would try back another time.

## 2019-05-24 NOTE — Telephone Encounter (Signed)
Follow up ° ° °Patient is returning call per previous message. Please call. °

## 2019-05-24 NOTE — Telephone Encounter (Signed)
Follow up:    Patient wife returning call she states that she would like to speak with Joellen Jersey about this issue. Please call back.

## 2019-05-24 NOTE — Telephone Encounter (Signed)
Error

## 2019-05-31 ENCOUNTER — Telehealth: Payer: Self-pay | Admitting: Cardiovascular Disease

## 2019-05-31 NOTE — Telephone Encounter (Signed)
Antonio Lawrence reports they tried Viagra 50 mg last weekend and it didn't work at all until the next morning. They called the office and were instructed to increase the dose to 100 mg. They tried that and "nothing happened at all."  They would like to see if Dr. Burt Knack will prescribe another medication.   On the upside, he is tolerating Crestor well and has no cramping.

## 2019-05-31 NOTE — Telephone Encounter (Signed)
Dr.Cooper patient. Thanks!

## 2019-05-31 NOTE — Telephone Encounter (Signed)
New Message   Pt c/o medication issue:  1. Name of Medication: rosuvastatin (CRESTOR) 10 MG tablet and sildenafil (VIAGRA) 100 MG tablet     2. How are you currently taking this medication (dosage and times per day)?   3. Are you having a reaction (difficulty breathing--STAT)?   4. What is your medication issue? Patients wife is calling on his behalf she states that they have some questions about the medication. She would not provide any additional information other than that [name] would know.

## 2019-06-01 NOTE — Telephone Encounter (Signed)
Ok to try cialis 10 mg. Otherwise would follow-up with primary care or urology. thanks

## 2019-06-02 NOTE — Telephone Encounter (Signed)
Left message to call back  

## 2019-06-03 MED ORDER — TADALAFIL 10 MG PO TABS: 10.0000 mg | ORAL_TABLET | Freq: Every day | ORAL | 1 refills | Status: DC | PRN

## 2019-06-03 NOTE — Telephone Encounter (Signed)
Left message to call back  

## 2019-06-03 NOTE — Telephone Encounter (Signed)
Instructed patient's wife to have him STOP SILDENAFIL and START CIALIS 10 mg.  Called into [facility]. She understands to call PCP if issues continue after trying Cialis. She was grateful for assistance.

## 2019-06-10 NOTE — Telephone Encounter (Signed)
Follow up   Patient's wife is calling in and requesting a call back about the medication change. Please give patient's wife a call back.

## 2019-06-11 NOTE — Telephone Encounter (Signed)
Pt's wife refused to discuss message will wait til [name] is back in office .Message forwarded to Lenice Llamas  RN  for review./cy

## 2019-06-11 NOTE — Telephone Encounter (Signed)
Follow Up  Patient's wife is calling back about the medication. Please give her a call back. Prefers to speak with Curt Bears, but I did inform patient that she is not in office today.

## 2019-06-21 NOTE — Telephone Encounter (Signed)
Ms. Mangano called to confirm Cialis works for the patient and they are very happy with results. They are grateful for assistance.

## 2019-06-25 ENCOUNTER — Other Ambulatory Visit: Payer: Self-pay | Admitting: Cardiovascular Disease

## 2019-06-25 NOTE — Telephone Encounter (Signed)
Prescription refill request for Eliquis received.  Last office visit: 04/28/2019 ( Dr. Burt Knack) Scr: 1.26 (10/30/2018) Age: 73 y.o. Weight: 83 kg (04/28/2019)  Prescription refill sent.

## 2019-08-10 ENCOUNTER — Encounter: Payer: Self-pay | Admitting: *Deleted

## 2019-08-16 ENCOUNTER — Telehealth: Payer: Self-pay | Admitting: Cardiovascular Disease

## 2019-08-16 MED ORDER — TADALAFIL 10 MG PO TABS: 10.0000 mg | ORAL_TABLET | Freq: Every day | ORAL | 3 refills | Status: DC | PRN

## 2019-08-16 NOTE — Telephone Encounter (Signed)
New Message:     Pt's wife said she did not want to talk to anybody but you. I told her you were in clinic.She said she wants to talk to you about the pneumonia shot and pt's medicine.

## 2019-08-16 NOTE — Telephone Encounter (Signed)
Ms. Antonio Lawrence requests a refill on Cialis. She states the patient went to his PCP recently and was told his could take Cialis 20 mg if needed. Ms. Antonio Lawrence states the patient took 2 Saturday night and she feels like 10 mg is probably sufficient.  She also states he got his pneumonia vaccine and asked if it was OK. Informed her it was fine.  Refills sent. She was grateful for assistance.

## 2019-08-23 ENCOUNTER — Other Ambulatory Visit: Payer: Medicare HMO | Admitting: *Deleted

## 2019-08-23 ENCOUNTER — Other Ambulatory Visit: Payer: Self-pay

## 2019-08-23 DIAGNOSIS — E782 Mixed hyperlipidemia: Secondary | ICD-10-CM

## 2019-08-23 LAB — HEPATIC FUNCTION PANEL
ALT: 17 IU/L (ref 0–44)
AST: 19 IU/L (ref 0–40)
Albumin: 4 g/dL (ref 3.7–4.7)
Alkaline Phosphatase: 68 IU/L (ref 39–117)
Bilirubin Total: 0.5 mg/dL (ref 0.0–1.2)
Bilirubin, Direct: 0.17 mg/dL (ref 0.00–0.40)
Total Protein: 6.9 g/dL (ref 6.0–8.5)

## 2019-08-23 LAB — LIPID PANEL
Chol/HDL Ratio: 3.5 ratio (ref 0.0–5.0)
Cholesterol, Total: 140 mg/dL (ref 100–199)
HDL: 40 mg/dL (ref >39)
LDL Chol Calc (NIH): 76 mg/dL (ref 0–99)
Triglycerides: 139 mg/dL (ref 0–149)
VLDL Cholesterol Cal: 24 mg/dL (ref 5–40)

## 2019-10-05 ENCOUNTER — Telehealth: Payer: Self-pay | Admitting: Cardiovascular Disease

## 2019-10-05 DIAGNOSIS — I4819 Other persistent atrial fibrillation: Secondary | ICD-10-CM

## 2019-10-05 DIAGNOSIS — Z129 Encounter for screening for malignant neoplasm, site unspecified: Secondary | ICD-10-CM

## 2019-10-05 NOTE — Telephone Encounter (Signed)
Ms. Held reports about 5 weeks ago, the patient had what was thought to be a burst blood vessel in his eye. It resolved after several days. Yesterday, the redness came back again so they called the ophthalmologist for recommendations. Dr. Kathlen Mody told them it could be allergies since the eye is really red and runny, but it could also be due to the patient's blood thinners and to call Cardiology for blood work recommendations.   The patient has an appointment with Richardson Dopp 12/8 but is willing to come in for blood work Monday 11/30. If Nicki Reaper wishes to order blood work, the patient requests a PSA drawn as well.  To Richardson Dopp for recommendations.

## 2019-10-05 NOTE — Telephone Encounter (Signed)
Patient's wife states that her husbands Ophthalmologist saw some bleeding on the eyes and thinks it may be in regards to some of his heart medication. Please advise.

## 2019-10-05 NOTE — Telephone Encounter (Signed)
No hx of CVA documented in chart. Ok to hold Eliquis for 2 days (4 doses), then resume. Arrange BMET, CBC. Richardson Dopp, PA-C    10/05/2019 3:59 PM

## 2019-10-05 NOTE — Telephone Encounter (Signed)
Instructed Ms. Perguson to have the patient hold Eliquis for 4 doses then resume.  BMET, CBC scheduled 11/30. Per Richardson Dopp, OK to order PSA. She was grateful for call and agrees with treatment plan.

## 2019-10-11 ENCOUNTER — Other Ambulatory Visit: Payer: Self-pay

## 2019-10-11 ENCOUNTER — Encounter (INDEPENDENT_AMBULATORY_CARE_PROVIDER_SITE_OTHER): Payer: Self-pay

## 2019-10-11 ENCOUNTER — Other Ambulatory Visit: Payer: Medicare HMO | Admitting: *Deleted

## 2019-10-11 DIAGNOSIS — Z129 Encounter for screening for malignant neoplasm, site unspecified: Secondary | ICD-10-CM

## 2019-10-11 DIAGNOSIS — I4819 Other persistent atrial fibrillation: Secondary | ICD-10-CM

## 2019-10-12 LAB — PSA: Prostate Specific Ag, Serum: 1.3 ng/mL (ref 0.0–4.0)

## 2019-10-12 LAB — CBC WITH DIFFERENTIAL/PLATELET
Basophils Absolute: 0 10*3/uL (ref 0.0–0.2)
Basos: 1 %
EOS (ABSOLUTE): 0.2 10*3/uL (ref 0.0–0.4)
Eos: 4 %
Hematocrit: 39.9 % (ref 37.5–51.0)
Hemoglobin: 13.8 g/dL (ref 13.0–17.7)
Immature Grans (Abs): 0 10*3/uL (ref 0.0–0.1)
Immature Granulocytes: 0 %
Lymphocytes Absolute: 1.2 10*3/uL (ref 0.7–3.1)
Lymphs: 20 %
MCH: 31.6 pg (ref 26.6–33.0)
MCHC: 34.6 g/dL (ref 31.5–35.7)
MCV: 91 fL (ref 79–97)
Monocytes Absolute: 0.7 10*3/uL (ref 0.1–0.9)
Monocytes: 11 %
Neutrophils Absolute: 4 10*3/uL (ref 1.4–7.0)
Neutrophils: 64 %
Platelets: 270 10*3/uL (ref 150–450)
RBC: 4.37 x10E6/uL (ref 4.14–5.80)
RDW: 12.3 % (ref 11.6–15.4)
WBC: 6.1 10*3/uL (ref 3.4–10.8)

## 2019-10-12 LAB — BASIC METABOLIC PANEL
BUN/Creatinine Ratio: 14 (ref 10–24)
BUN: 16 mg/dL (ref 8–27)
CO2: 25 mmol/L (ref 20–29)
Calcium: 9 mg/dL (ref 8.6–10.2)
Chloride: 100 mmol/L (ref 96–106)
Creatinine, Ser: 1.11 mg/dL (ref 0.76–1.27)
GFR calc Af Amer: 76 mL/min/{1.73_m2} (ref >59)
GFR calc non Af Amer: 66 mL/min/{1.73_m2} (ref >59)
Glucose: 115 mg/dL — ABNORMAL HIGH (ref 65–99)
Potassium: 4.4 mmol/L (ref 3.5–5.2)
Sodium: 138 mmol/L (ref 134–144)

## 2019-10-17 ENCOUNTER — Other Ambulatory Visit: Payer: Self-pay | Admitting: Physician Assistant

## 2019-10-18 ENCOUNTER — Ambulatory Visit: Payer: Medicare HMO | Admitting: Physician Assistant

## 2019-10-18 NOTE — Progress Notes (Signed)
Cardiology Office Note:    Date:  10/19/2019   ID:  WENTWORTH EDELEN, DOB 25-Jan-1946, MRN [id number]  PCP:  Christain Sacramento, MD  Cardiologist:  Sherren Mocha, MD  Electrophysiologist:  None   Referring MD: Christain Sacramento, MD   Chief Complaint  Patient presents with  . Follow-up    CAD, AFib    History of Present Illness:    Antonio Lawrence is a 73 y.o. male with:   Coronary artery disease   S/p inf-post MI in 08/2014 >> PCI: DES to OM1  Persistent AFib  S/p DCCV 01/2018 >> ERAF  Rate control strategy  CHA2DS2-VASc=3 (HTN, CAD, age x 1) >> Apixaban    Hypertension   Hyperlipidemia   Prior GI bleeding  Antonio Lawrence was last seen by Dr. Burt Knack in 04/2019.    He returns for follow-up.  He has been doing well without chest pain, shortness of breath, syncope, orthopnea or leg swelling.   Prior CV studies:   The following studies were reviewed today:   Echo 12/01/17:  Mild LVH, EF 55-60, mild MR  Echo 11/25/14:  Mild LVH, EF 55-60%, normal wall motion, grade 1 diastolic dysfunction, mildly dilated ascending aorta at 42 mm, aortic root 40 mm, mild LAE, normal RVSF, mild RAE, mild TR, PASP 33 mmHg  LHC 09/03/14:  LVEF of 50-55% inferior lateral wall moderate hypokinesis. RCA mid occluded, L-R collaterals; LM 20-30%; LCx with OM1 proximal 99%; LAD mid intramyocardial bridging, mid 40%,distal 70-80%;  PCI: Successful PTCA and stenting of the Proximal OM1 with 2.5 x 18 mm Xience Alpine DES.  Past Medical History:  Diagnosis Date  . Coronary artery disease    Chronic occlusion of the RCA with left-to-right collaterals, PCI of the left circumflex/obtuse marginal, moderate nonobstructive disease of the LAD  . Hyperglycemia   . Hyperlipidemia   . Hypertension   . Lung cancer ([place])   . Myocardial infarction [facility])    Inferoposterior STEMI October 2015, PCI of the first OM branch with a drug-eluting stent   Surgical Hx: The patient  has a past surgical history  that includes Lobectomy; left heart cath (Bilateral, 09/03/2014); Colonoscopy with propofol (N/A, 01/13/2015); enteroscopy (N/A, 01/13/2015); and Cardioversion (N/A, 01/09/2018).   Current Medications: Current Meds  Medication Sig  . carvedilol (COREG) 25 MG tablet TAKE 1/2 (ONE-HALF) TABLET BY MOUTH TWICE DAILY WITH A MEAL  . ELIQUIS 5 MG TABS tablet Take 1 tablet by mouth twice daily  . lisinopril-hydrochlorothiazide (PRINZIDE,ZESTORETIC) 20-12.5 MG tablet TAKE 1 TABLET BY MOUTH ONCE DAILY  . Multiple Vitamin (MULTI-VITAMIN DAILY PO) Take 1 tablet by mouth daily.  . nitroGLYCERIN (NITROSTAT) 0.4 MG SL tablet DISSOLVE ONE TABLET UNDER THE TONGUE EVERY 5 MINUTES AS NEEDED FOR CHEST PAIN.  DO NOT EXCEED A TOTAL OF 3 DOSES IN 15 MINUTES  . rosuvastatin (CRESTOR) 10 MG tablet Take 1 tablet (10 mg total) by mouth daily.  . tadalafil (CIALIS) 10 MG tablet Take 1 tablet (10 mg total) by mouth daily as needed for erectile dysfunction.     Allergies:   Patient has no known allergies.   Social History   Tobacco Use  . Smoking status: Former Smoker    Types: Cigarettes    Quit date: 12/26/2003    Years since quitting: 15.8  . Smokeless tobacco: Never Used  Substance Use Topics  . Alcohol use: No  . Drug use: No     Family Hx: The patient's family history includes Cancer in  his father; Heart attack in his mother.  ROS:   Please see the history of present illness.    ROS All other systems reviewed and are negative.   EKGs/Labs/Other Test Reviewed:    EKG:  EKG is  ordered today.  The ekg ordered today demonstrates atrial fibrillation, HR 60, normal axis, QTC 402, no change from prior tracing  Recent Labs: 08/23/2019: ALT 17 10/11/2019: BUN 16; Creatinine, Ser 1.11; Hemoglobin 13.8; Platelets 270; Potassium 4.4; Sodium 138   Recent Lipid Panel Lab Results  Component Value Date/Time   CHOL 140 08/23/2019 09:07 AM   TRIG 139 08/23/2019 09:07 AM   HDL 40 08/23/2019 09:07 AM   CHOLHDL 3.5  08/23/2019 09:07 AM   CHOLHDL 3.3 12/01/2015 09:57 AM   LDLCALC 76 08/23/2019 09:07 AM    Physical Exam:    VS:  BP 140/60   Pulse 60   Ht 5\' 8"  (1.727 m)   Wt 186 lb 9.6 oz (84.6 kg)   BMI 28.37 kg/m     Wt Readings from Last 3 Encounters:  10/19/19 186 lb 9.6 oz (84.6 kg)  04/28/19 183 lb (83 kg)  10/30/18 186 lb (84.4 kg)     Physical Exam  Constitutional: He is oriented to person, place, and time. He appears well-developed and well-nourished. No distress.  HENT:  Head: Normocephalic and atraumatic.  Eyes: No scleral icterus.  Neck: Neck supple. No JVD present. No thyromegaly present.  Cardiovascular: Normal rate, S1 normal, S2 normal and normal heart sounds. An irregularly irregular rhythm present.  No murmur heard. Pulmonary/Chest: Effort normal and breath sounds normal. He has no rales.  Abdominal: Soft. There is no hepatomegaly.  Musculoskeletal:        General: No edema.  Lymphadenopathy:    He has no cervical adenopathy.  Neurological: He is alert and oriented to person, place, and time.  Skin: Skin is warm and dry.  Psychiatric: He has a normal mood and affect.    ASSESSMENT & PLAN:    1. Coronary artery disease involving native coronary artery of native heart without angina pectoris History of MI in 2015 treated with a drug-eluting stent to the OM1.  He has a chronically occluded RCA.  He is doing well without chest pain.  Continue Carvedilol, Lisinopril, Rosuvastatin.    2. Persistent atrial fibrillation (HCC) Rate controlled.  He is tolerating anticoagulation.  Creatinine and Hgb normal in 09/2019.  He has had some issues with subconjunctival hemorrhage and did hold his Apixaban recently for 2 days.  Continue current dose of Apixaban.    3. Essential hypertension BP up today.  I have asked him to monitor this and send some readings for review.  If his BP is above target, we can adjust his dose of Lisinopril.    4. Hyperlipidemia, unspecified  hyperlipidemia type LDL optimal on most recent lab work.  Continue current Rx.      Dispo:  Return in about 6 months (around 04/18/2020) for Routine Follow Up, w/ Dr. Burt Knack, or Richardson Dopp, PA-C.   Medication Adjustments/Labs and Tests Ordered: Current medicines are reviewed at length with the patient today.  Concerns regarding medicines are outlined above.  Tests Ordered: Orders Placed This Encounter  Procedures  . EKG 12-Lead   Medication Changes: No orders of the defined types were placed in this encounter.   Signed, Richardson Dopp, PA-C  10/19/2019 5:37 PM    [facility] [place], [place], [place]  [address]  Phone: [phone]; Fax: [phone]

## 2019-10-19 ENCOUNTER — Other Ambulatory Visit: Payer: Self-pay

## 2019-10-19 ENCOUNTER — Encounter: Payer: Self-pay | Admitting: Physician Assistant

## 2019-10-19 ENCOUNTER — Ambulatory Visit: Payer: Medicare HMO | Admitting: Physician Assistant

## 2019-10-19 VITALS — BP 140/60 | HR 60 | Ht 68.0 in | Wt 186.6 lb

## 2019-10-19 DIAGNOSIS — I4819 Other persistent atrial fibrillation: Secondary | ICD-10-CM | POA: Diagnosis not present

## 2019-10-19 DIAGNOSIS — I2510 Atherosclerotic heart disease of native coronary artery without angina pectoris: Secondary | ICD-10-CM

## 2019-10-19 DIAGNOSIS — I10 Essential (primary) hypertension: Secondary | ICD-10-CM | POA: Diagnosis not present

## 2019-10-19 DIAGNOSIS — E785 Hyperlipidemia, unspecified: Secondary | ICD-10-CM

## 2019-10-19 NOTE — Patient Instructions (Signed)
Medication Instructions:   Your physician recommends that you continue on your current medications as directed. Please refer to the Current Medication list given to you today. *If you need a refill on your cardiac medications before your next appointment, please call your pharmacy*  Lab Work: [place]   If you have labs (blood work) drawn today and your tests are completely normal, you will receive your results only by: Marland Kitchen MyChart Message (if you have MyChart) OR . A paper copy in the mail If you have any lab test that is abnormal or we need to change your treatment, we will call you to review the results.  Testing/Procedures: NONE ORDERED  TODAY  Follow-Up: At [facility], you and your health needs are our priority.  As part of our continuing mission to provide you with exceptional heart care, we have created designated Provider Care Teams.  These Care Teams include your primary Cardiologist (physician) and Advanced Practice Providers (APPs -  Physician Assistants and Nurse Practitioners) who all work together to provide you with the care you need, when you need it.  Your next appointment:   6 month(s)  The format for your next appointment:   Either In Person or Virtual  Provider:   You may see Sherren Mocha, MD   Other Instructions:  Take blood pressure for 2 weeks and call back with readings

## 2019-10-24 ENCOUNTER — Other Ambulatory Visit: Payer: Self-pay | Admitting: Physician Assistant

## 2019-10-24 ENCOUNTER — Other Ambulatory Visit: Payer: Self-pay | Admitting: Cardiovascular Disease

## 2019-10-24 DIAGNOSIS — E785 Hyperlipidemia, unspecified: Secondary | ICD-10-CM

## 2019-10-24 DIAGNOSIS — I10 Essential (primary) hypertension: Secondary | ICD-10-CM

## 2019-10-25 NOTE — Telephone Encounter (Signed)
Refill for Lisinopril/HCTZ sent to pharmacy

## 2019-10-27 ENCOUNTER — Telehealth: Payer: Self-pay | Admitting: Cardiovascular Disease

## 2019-10-27 NOTE — Telephone Encounter (Signed)
The patient's wife called to arrange an appointment with Dr. Burt Knack. She understands the schedule is not available yet. She was grateful for assistance.

## 2019-10-27 NOTE — Telephone Encounter (Signed)
Patient's wife calling to speak with [name] about the patients appointment and medication.

## 2019-11-01 ENCOUNTER — Telehealth: Payer: Self-pay | Admitting: Cardiovascular Disease

## 2019-11-01 DIAGNOSIS — I10 Essential (primary) hypertension: Secondary | ICD-10-CM

## 2019-11-01 MED ORDER — LISINOPRIL-HYDROCHLOROTHIAZIDE 20-12.5 MG PO TABS: 2.0000 | ORAL_TABLET | Freq: Every day | ORAL | 1 refills | Status: DC

## 2019-11-01 NOTE — Telephone Encounter (Signed)
Spouse calling back to advise that patient has been taking Lisinopril- HCTZ  1/2 tab twice a day.  She will have patient take 2 tabs at once in the AM starting tomorrow.

## 2019-11-01 NOTE — Telephone Encounter (Signed)
Coreg needs to be taken 12 hours apart (e.g. 7 am and 7 pm or 8 am and 8 pm). Take Lisinopril/HCTZ in the AM with the 1st dose of Coreg.  PLAN:  1. Increase Lisinopril/HCTZ to 40/25 mg once daily  2. BMET 2 weeks after increase. 3. Notify us if BP consistently > 130/80. Richardson Dopp, PA-C    11/01/2019 11:47 AM

## 2019-11-01 NOTE — Telephone Encounter (Signed)
Spoke with patient spouse, advised of recommendations.  Updated rx sent to pharmacy.  BMET ordered and lab appointment scheduled.  Spouse advised she will continue to monitor BP and notify as recommended.

## 2019-11-01 NOTE — Telephone Encounter (Signed)
New Message  Pt c/o BP issue: STAT if pt c/o blurred vision, one-sided weakness or slurred speech  1. What are your last 5 BP readings? N/A  2. Are you having any other symptoms (ex. Dizziness, headache, blurred vision, passed out)? No  3. What is your BP issue? Patient's BP is elevated and patient's wife would like a call back from the nurse about it.

## 2019-11-01 NOTE — Telephone Encounter (Signed)
Patient spouse reports concern about patient blood pressure and when patient should be taking medications.  Spouse only able to provide the last 2 days of BP readings: 12/19 2200  152/83 HR 79           2300  139/79  HR 82 12/20  1130  138/70 HR unknown            2200   153/92   HR 80 Spouse reports that patient is asymptomatic.  He is currently being treated for a sinus infection with Amoxicillin, PCP informed her that this should not affect patient BP.  Spouse states that patient takes his BP meds twice a day but could not identify which meds he takes.  Per orders advised patient should be taking Carvedilol 12.5mg  BID and Lisinopril-HCTZ 20-12.5 mg once daily.   Spouse unable to provide any other BP results, she is insistent that MD be notified that patient BP is elevated and requests she be informed of exact times of day that patient should be taking BP medications and what to do about the elevated BP.

## 2019-11-01 NOTE — Telephone Encounter (Signed)
Patient calling back to speak with Amy per last telephone note. States she has some more questions in regards to his medication.

## 2019-11-02 ENCOUNTER — Telehealth: Payer: Self-pay | Admitting: Cardiovascular Disease

## 2019-11-02 NOTE — Telephone Encounter (Signed)
P{t spouse reporting patient BP today 107/64 1 hour after taking Lisinopril-hydrochlorothiazide 40-12.5mg .  She reports pt feels great.  Advised BP not too low and to continue to monitor how pt feeling.

## 2019-11-02 NOTE — Telephone Encounter (Signed)
Patient's wife asking to speak with Amy again per last telephone note. States it is in regards to patients BP and medication. I asked if his BP was elevated, she stated it was not and that she would like the nurse to give her call.

## 2019-11-04 ENCOUNTER — Telehealth: Payer: Self-pay | Admitting: Physician Assistant

## 2019-11-04 NOTE — Telephone Encounter (Signed)
Patient spouse reporting pt BP for past 2 days: 12/22 AM 107/64    PM 122/72 12/23 AM 122/73    PM 125/72 Pt did not record HR readings.  Advised of normal range for HR to monitor for.  Per called pt feels good and she even feels he has been more "perky" the past 2 days.

## 2019-11-04 NOTE — Telephone Encounter (Signed)
Follow up: ° ° ° °Patient returning your call. °

## 2019-11-04 NOTE — Telephone Encounter (Signed)
Follow up:      Patient returning a call back concering BP. Please call back.

## 2019-11-08 ENCOUNTER — Telehealth: Payer: Self-pay | Admitting: Cardiovascular Disease

## 2019-11-08 NOTE — Telephone Encounter (Signed)
New Message:    Wife called and said she would like to talk to [facility] about pt's blood pressure. I tried to ask her questions, she said she did not have that info available. She just need to talk to [facility].

## 2019-11-08 NOTE — Telephone Encounter (Signed)
Called and spoke with patient's wife.  She reports many BP and HR readings over the last few days: 12/24: PM 119/74 HR 68 12/25: AM 129/79 HR 83  PM 128/82 HR 72 12/26: AM 117/75 HR 77  PM 116/75 HR 59 12/27: AM 90/58 HR 68  PM 132/83 HR 74 12/28: 103/67 HR 78  She is concerned about the couple low BP readings. She states Mr. Cuff never complains of dizziness or lightheadedness and actually feels "great."  Reiterated to her to continue checking BP 1-2 hours after he takes his meds and if he feels poorly. She will call back if BP stays consistently on the low side or if he develops symptoms. He will also be sure to stay hydrated.  She was grateful for assistance.

## 2019-11-15 ENCOUNTER — Other Ambulatory Visit: Payer: Self-pay

## 2019-11-15 ENCOUNTER — Other Ambulatory Visit: Payer: Medicare HMO | Admitting: *Deleted

## 2019-11-15 LAB — BASIC METABOLIC PANEL
BUN/Creatinine Ratio: 17 (ref 10–24)
BUN: 20 mg/dL (ref 8–27)
CO2: 24 mmol/L (ref 20–29)
Calcium: 9.2 mg/dL (ref 8.6–10.2)
Chloride: 97 mmol/L (ref 96–106)
Creatinine, Ser: 1.18 mg/dL (ref 0.76–1.27)
GFR calc Af Amer: 70 mL/min/{1.73_m2} (ref >59)
GFR calc non Af Amer: 61 mL/min/{1.73_m2} (ref >59)
Glucose: 139 mg/dL — ABNORMAL HIGH (ref 65–99)
Potassium: 4.3 mmol/L (ref 3.5–5.2)
Sodium: 135 mmol/L (ref 134–144)

## 2019-11-16 ENCOUNTER — Telehealth: Payer: Self-pay | Admitting: Cardiovascular Disease

## 2019-11-16 NOTE — Telephone Encounter (Signed)
New message   Patient's wife is calling to get lab results. Please call.

## 2019-11-16 NOTE — Telephone Encounter (Signed)
I called and spoke with patients wife. She is aware that lab results from yesterday are not back yet and she will receive a phone call when they come in.

## 2019-11-17 ENCOUNTER — Telehealth: Payer: Self-pay | Admitting: Cardiovascular Disease

## 2019-11-17 NOTE — Telephone Encounter (Signed)
Pt c/o BP issue: STAT if pt c/o blurred vision, one-sided weakness or slurred speech  1. What are your last 5 BP readings? She said she need to talk to you about his blood pressure--she said thereadings were in the car *  2. Are you having any other symptoms (ex. Dizziness, headache, blurred vision, passed out)?   3. What is your BP issue?  I asked was it high or low- she said she did not know

## 2019-11-17 NOTE — Telephone Encounter (Signed)
Left message to call back  

## 2019-11-17 NOTE — Telephone Encounter (Signed)
Patient returning call.

## 2019-11-17 NOTE — Telephone Encounter (Signed)
Ms. Keidel called to get lab results from 1/4. Reviewed results with patient who verbalized understanding.   She also wanted to make sure it was OK for the patient to take Cialis 2 times in one week. Informed her he can, but should not take more than once daily. She also asked for COVID vaccine information. Instructed her to contact PCP or [facility] for up-to-date information on administration. She was grateful for assistance.

## 2019-11-22 ENCOUNTER — Telehealth: Payer: Self-pay | Admitting: Cardiovascular Disease

## 2019-11-22 DIAGNOSIS — I10 Essential (primary) hypertension: Secondary | ICD-10-CM

## 2019-11-22 NOTE — Telephone Encounter (Signed)
Ms. Cerda reports the patient has had low BP (90s/60s) for the past several days.  He has also been very fatigued since having low BP. He took 1.5 tablets of lisinopril-HCTZ instead of 2 and he feels slightly better, but his BP is still a little low.  Instructed them to take 1 tablet daily and continue to monitor BP.  They will call Friday to review readings/symptoms. She was grateful for call.

## 2019-11-22 NOTE — Telephone Encounter (Signed)
New Message    Pts wife is calling to speak with [facility].  She says its about the pts BP running Low.  I asked for any readings and she stated she will have them available when [facility] calls her     Please call

## 2019-11-25 MED ORDER — LISINOPRIL-HYDROCHLOROTHIAZIDE 20-12.5 MG PO TABS: 1.0000 | ORAL_TABLET | Freq: Every day | ORAL | 3 refills | Status: DC

## 2019-11-25 NOTE — Addendum Note (Signed)
Addended by: Harland German A on: 11/25/2019 12:28 PM   Modules accepted: Orders

## 2019-11-25 NOTE — Telephone Encounter (Signed)
The patient's wife requests a visit on a Monday due to work schedule. Dr. Burt Knack is not in clinic on any Monday in June. Scheduled the patient for visit with Dr. Burt Knack 5/24. She was grateful for assistance.

## 2019-11-25 NOTE — Telephone Encounter (Signed)
Late entry from 0900 this AM. Spoke with the patient's wife, who states the patient is feeling much better taking 1 tablet of lisinopril HCT. His BP is 110-120/70s and he is no longer fatigued.  They will continue this dose. She will remind him to take his medications prior to his next cardiology visit so no uneccessary changes are made. She was grateful for assistance.

## 2019-11-29 ENCOUNTER — Telehealth: Payer: Self-pay | Admitting: Cardiovascular Disease

## 2019-11-29 DIAGNOSIS — E782 Mixed hyperlipidemia: Secondary | ICD-10-CM

## 2019-11-29 NOTE — Telephone Encounter (Signed)
Pt c/o medication issue:  1. Name of Medication:   2. How are you currently taking this medication (dosage and times per day)?Rosuvastatin  3. Are you having a reaction (difficulty breathing--STAT)? no  4. What is your medication issue?  Cramping inside thigh, hands, knees all way down in his legs- it happen at night

## 2019-11-29 NOTE — Telephone Encounter (Signed)
Left message to call back  

## 2019-11-29 NOTE — Telephone Encounter (Signed)
Ms. Rivet called to report the patient has had cramping for about 2 weeks while trying to sleep and he blames Crestor. He had cramping so badly this weekend he was almost in tears. He held his Crestor for a couple days and already feels much better and is sleeping better. He has tried Lipitor and Crestor and is willing to try another drug.  To Lipid Clinic for recommendations.

## 2019-11-30 MED ORDER — PRAVASTATIN SODIUM 20 MG PO TABS: 10.0000 mg | ORAL_TABLET | Freq: Every evening | ORAL | 3 refills | Status: DC

## 2019-11-30 NOTE — Telephone Encounter (Signed)
Reviewed options with patient's wife. At this time, they agree to try pravastatin 10 mg daily. FLP and LFTs scheduled in 3 months.  She was grateful for call and agrees with treatment plan.

## 2019-11-30 NOTE — Telephone Encounter (Signed)
Pt has tried atorvastatin 80mg  daily and rosuvastatin 10mg  daily. Would see if he is willing to try pravastatin 10mg  daily (can cut 20mg  tablets in half). If he is opposed to trying another statin, could try Zetia 10mg  daily or have him seen in lipid clinic to discuss PCSK9i injectable therapy.

## 2019-12-06 ENCOUNTER — Telehealth: Payer: Self-pay | Admitting: Cardiovascular Disease

## 2019-12-06 NOTE — Telephone Encounter (Signed)
Patients wife is calling requesting Lenice Llamas give her a call in regards to the patient experiencing cramps.

## 2019-12-06 NOTE — Telephone Encounter (Signed)
Patient's wife reports Antonio Lawrence cramping is improving since stopping Crestor. She also states his BP is good since med change.  She also states he had pink-tinged ejaculate over the weekend and thinks he could have a UTI. He used AZO a couple weeks ago when he had burning while urinating but could possibly still have residual. Instructed her to call PCP for further testing/ med recommendations for UTI.

## 2019-12-06 NOTE — Telephone Encounter (Signed)
Left message to call back  

## 2019-12-06 NOTE — Telephone Encounter (Signed)
Wife of patient called back returning 48 call

## 2019-12-27 ENCOUNTER — Other Ambulatory Visit: Payer: Self-pay | Admitting: Cardiovascular Disease

## 2019-12-27 NOTE — Telephone Encounter (Signed)
Eliquis 5mg  refill request received, pt is 74yrs old, weight-84.6kg, Crea-1.18 on 11/15/2019, Diagnosis-Afib, and last seen by Richardson Dopp on 10/19/2019. Dose is appropriate based on dosing criteria. Will send in refill to requested pharmacy.

## 2019-12-27 NOTE — Telephone Encounter (Signed)
*  STAT* If patient is at the pharmacy, call can be transferred to refill team.   1. Which medications need to be refilled? (please list name of each medication and dose if known)  Eliquis  2. Which pharmacy/location (including street and city if local pharmacy) is medication to be sent to? [facility] [place]  3. Do they need a 30 day or 90 day supply? 30 days and refills- need this called in today- he is out of medicine

## 2020-02-01 ENCOUNTER — Telehealth: Payer: Self-pay | Admitting: Cardiovascular Disease

## 2020-02-01 NOTE — Telephone Encounter (Signed)
  Pt's wife calling, requesting a callback from Dr. Antionette Char nurse.

## 2020-02-01 NOTE — Telephone Encounter (Signed)
Spoke to the patient's wife, who states the patient broke a tooth and will need to have it pulled soon. Informed her that if the dentist needs cardiac clearance to hold medications, to call or fax with info. She was grateful for assistance.

## 2020-02-28 ENCOUNTER — Other Ambulatory Visit: Payer: Self-pay

## 2020-02-28 ENCOUNTER — Other Ambulatory Visit: Payer: Medicare HMO | Admitting: *Deleted

## 2020-02-28 DIAGNOSIS — E782 Mixed hyperlipidemia: Secondary | ICD-10-CM

## 2020-02-28 LAB — HEPATIC FUNCTION PANEL
ALT: 16 IU/L (ref 0–44)
AST: 19 IU/L (ref 0–40)
Albumin: 3.9 g/dL (ref 3.7–4.7)
Alkaline Phosphatase: 66 IU/L (ref 39–117)
Bilirubin Total: 0.4 mg/dL (ref 0.0–1.2)
Bilirubin, Direct: 0.12 mg/dL (ref 0.00–0.40)
Total Protein: 7 g/dL (ref 6.0–8.5)

## 2020-02-28 LAB — LIPID PANEL
Chol/HDL Ratio: 4.6 ratio (ref 0.0–5.0)
Cholesterol, Total: 185 mg/dL (ref 100–199)
HDL: 40 mg/dL (ref >39)
LDL Chol Calc (NIH): 123 mg/dL — ABNORMAL HIGH (ref 0–99)
Triglycerides: 120 mg/dL (ref 0–149)
VLDL Cholesterol Cal: 22 mg/dL (ref 5–40)

## 2020-03-02 ENCOUNTER — Telehealth: Payer: Self-pay | Admitting: Cardiovascular Disease

## 2020-03-02 NOTE — Telephone Encounter (Signed)
Diane is calling requesting Antonio Lawrence give her a call due to not hearing anything in regards to the results of Antonio Lawrence's blood work that was performed on Monday.

## 2020-03-02 NOTE — Telephone Encounter (Signed)
Left message that they will be called with results as soon as Dr. Burt Knack reviews them and gives recommendations.

## 2020-03-06 NOTE — Telephone Encounter (Signed)
Sounds good thanks. I'm on board with starting him on a PCSK9 or whatever you think is appropriate.

## 2020-03-06 NOTE — Telephone Encounter (Signed)
Wife returned call. Patient scheduled in lipid clinic on 5/3. [place] for wife to come to appointment. Per wife she wants all recommendations to be approved by Dr. Burt Knack.

## 2020-03-06 NOTE — Telephone Encounter (Signed)
Called and left voicemail for patient or wife to call back (per DPR). Will schedule patient in lipid clinic to discuss cholesterol lowering medication options.

## 2020-03-13 ENCOUNTER — Other Ambulatory Visit: Payer: Self-pay

## 2020-03-13 ENCOUNTER — Ambulatory Visit (INDEPENDENT_AMBULATORY_CARE_PROVIDER_SITE_OTHER): Payer: Medicare HMO | Admitting: Pharmacist

## 2020-03-13 DIAGNOSIS — E785 Hyperlipidemia, unspecified: Secondary | ICD-10-CM

## 2020-03-13 NOTE — Progress Notes (Signed)
Patient ID: [name]                 DOB: December 05, 1945                    MRN: [id number]     HPI: Antonio Lawrence is a 74 y.o. male patient referred to lipid clinic by Antonio Lawrence.  Cardiologist is Antonio Lawrence. PMH is significant for CAD, hypertension, HLD, persistent A Fib, history of STEMI in 2015 treated with PCI and drug eluting stent, and history of GI bleed.  Last seen by Dr Antonio Lawrence on 04/28/19 and advised to continue atorvastatin.  Changed to rosuvastatin on 05/17/19 due to muscle cramping.  Patient called back on 11/29/19 and reported rosuvastatin was causing cramping in thigh, hands, knees and legs.  Cramping improved once rosuvastatin was discontinued and patient was started on pravastatin 10 mg daily.  Patient's wife spoke with Antonio Lawrence regarding starting a PCSK9 which has approval from Dr Antonio Lawrence.  Antonio Lawrence is a pleasant gentleman who came to lipid clinic for medication consult.  He is starting to develop cramping from pravastatin 10 mg and LDL has increased since last Summer from 76 mg/dl on 08/23/19 to 123 mg/dl on 02/28/20.  Reports a healthy diet with no alcohol and is physically active during the day.  Has appointment with Dr Antonio Lawrence at the end of this month.  Is willing to try a new medication. Checked insurance formulary and the PCSK9 preferred by his plan is Praluent.  Current Medications: Pravastatin 10mg  daily  Intolerances: Atorvastatin 80mg , rosuvastatin 10mg . Risk Factors: CHADS2VASC Score: 3,  CAD, age, history of MI LDL goal: < 70 mg/dl   Diet:   Chicken, vegetables, salad, drinks water, breakfast bar  Exercise: Active at work, used to go to the gym  Family History: Mother has history of MI  Social History: Former smoker, quit in 2005.  No alcohol.  Labs: TC: 185, Trigs 120, HDL 40, LDL 123 on pravastatin 10mg    Past Medical History:  Diagnosis Date  . Coronary artery disease    Chronic occlusion of the RCA with left-to-right collaterals, PCI of  the left circumflex/obtuse marginal, moderate nonobstructive disease of the LAD  . Hyperglycemia   . Hyperlipidemia   . Hypertension   . Lung cancer ([place])   . Myocardial infarction [facility])    Inferoposterior STEMI October 2015, PCI of the first OM branch with a drug-eluting stent    Current Outpatient Medications on File Prior to Visit  Medication Sig Dispense Refill  . carvedilol (COREG) 25 MG tablet TAKE 1/2 (ONE-HALF) TABLET BY MOUTH TWICE DAILY WITH A MEAL 90 tablet 3  . ELIQUIS 5 MG TABS tablet Take 1 tablet by mouth twice daily 60 tablet 10  . lisinopril-hydrochlorothiazide (ZESTORETIC) 20-12.5 MG tablet Take 1 tablet by mouth daily. 90 tablet 3  . Multiple Vitamin (MULTI-VITAMIN DAILY PO) Take 1 tablet by mouth daily.    . nitroGLYCERIN (NITROSTAT) 0.4 MG SL tablet DISSOLVE ONE TABLET UNDER THE TONGUE EVERY 5 MINUTES AS NEEDED FOR CHEST PAIN.  DO NOT EXCEED A TOTAL OF 3 DOSES IN 15 MINUTES 25 tablet 0  . pravastatin (PRAVACHOL) 20 MG tablet Take 0.5 tablets (10 mg total) by mouth every evening. 45 tablet 3  . tadalafil (CIALIS) 10 MG tablet Take 1 tablet (10 mg total) by mouth daily as needed for erectile dysfunction. 10 tablet 3   No current facility-administered medications on file prior to visit.  No Known Allergies  Assessment/Plan:  1. Hyperlipidemia - Patient LDL 121 which is above goal of <70.  Due to multiple statin intolerances, recommend patient start Praluent 75 mg once every 2 weeks.  Using demo pen, educated patient on proper medication storage in refrigerator, allowing medication to come to room temperature for 30-45 minutes and proper injection sites such as abdomen and thighs.  Demonstrated how to administer medication using demo pen and educated to dispose of pen in sharps container  Counseled on medication side effects.  Patient reports he will be able to administer at home. Recommended patient continue to be physically active and practice healthy eating and  abstaining from alcohol.  Will discontinue pravastatin at this time due to muscle cramping.  Will complete prior authorization for Praluent and if approved, will place lab order for lipid profile in 8 weeks.  Karren Cobble, PharmD, BCACP, [place] [address], [place], [place] [address] Phone: [phone]; Fax: [phone] 03/13/2020 4:32 PM

## 2020-03-14 ENCOUNTER — Telehealth: Payer: Self-pay | Admitting: Pharmacist

## 2020-03-14 MED ORDER — PRALUENT 75 MG/ML ~~LOC~~ SOAJ: 1.0000 "pen " | SUBCUTANEOUS | 11 refills | Status: DC

## 2020-03-14 NOTE — Telephone Encounter (Addendum)
Praluent PA approved through 11/10/20.  Rx sent to [facility] in [place].  Spoke with patient's wife.  She is aware copay is 47 dollars and medication is ready for pickup.  Follow up labs being scheduled by Sammuel Bailiff RN

## 2020-03-15 ENCOUNTER — Telehealth: Payer: Self-pay | Admitting: Cardiovascular Disease

## 2020-03-15 DIAGNOSIS — E782 Mixed hyperlipidemia: Secondary | ICD-10-CM

## 2020-03-15 NOTE — Telephone Encounter (Signed)
Wife of patient called. The wife would like to talk to [facility] specifically about the patient's new medicine. The wife was not sure what it was called but she just said [name] would know what it was

## 2020-03-15 NOTE — Telephone Encounter (Signed)
Antonio Lawrence had many questions about Praluent.  Informed her it may be injected at night (or whenever most convenient) as it will be in his system for a couple weeks. Scheduled Antonio Lawrence for follow-up labs in 8 weeks. She was grateful for assistance.

## 2020-03-16 ENCOUNTER — Telehealth: Payer: Self-pay | Admitting: Cardiovascular Disease

## 2020-03-16 NOTE — Telephone Encounter (Signed)
Patient's wife calling to speak with [facility]. She states she has a question about the patient's new medication. I was unable to get more information as the phone was cutting in and out.

## 2020-03-16 NOTE — Telephone Encounter (Signed)
Ms. Shreve wanted to confirm the patient is to stop pravastatin. Per Lipid Clinic notes, informed her he is to stop pravastatin (due to leg cramping) and only administer Praluent.  She was grateful for assistance.

## 2020-03-16 NOTE — Telephone Encounter (Signed)
Left message to call back  

## 2020-04-03 ENCOUNTER — Ambulatory Visit: Payer: Medicare HMO | Admitting: Cardiovascular Disease

## 2020-04-03 ENCOUNTER — Encounter: Payer: Self-pay | Admitting: Cardiovascular Disease

## 2020-04-03 ENCOUNTER — Other Ambulatory Visit: Payer: Self-pay

## 2020-04-03 VITALS — BP 140/82 | HR 68 | Ht 68.0 in | Wt 177.6 lb

## 2020-04-03 DIAGNOSIS — E782 Mixed hyperlipidemia: Secondary | ICD-10-CM | POA: Diagnosis not present

## 2020-04-03 DIAGNOSIS — I2510 Atherosclerotic heart disease of native coronary artery without angina pectoris: Secondary | ICD-10-CM | POA: Diagnosis not present

## 2020-04-03 DIAGNOSIS — I4819 Other persistent atrial fibrillation: Secondary | ICD-10-CM

## 2020-04-03 NOTE — Progress Notes (Signed)
Cardiology Office Note:    Date:  04/03/2020   ID:  Antonio Lawrence, DOB [birth year], MRN [id number]  PCP:  Christain Sacramento, MD  Cardiologist:  Sherren Mocha, MD  Electrophysiologist:  None   Referring MD: Christain Sacramento, MD   Chief Complaint  Patient presents with  . Coronary Artery Disease    History of Present Illness:    Antonio Lawrence is a 74 y.o. male with a hx of:  Coronary artery disease  ? S/p inf-post MI in 08/2014 >> PCI: DES to OM1  Persistent AFib ? S/p DCCV 01/2018 >> ERAF ? Rate control strategy ? CHA2DS2-VASc=3 (HTN, CAD, age x 1) >> Apixaban    Hypertension   Hyperlipidemia   Prior GI bleeding  He is here alone today. He reports that he's doing very well from a cardiac perspective. Today, he denies symptoms of palpitations, chest pain, shortness of breath, orthopnea, PND, lower extremity edema, dizziness, or syncope.  He is active with his work in the [employer] and states that he is on his feet all day.  He is not engaged in routine exercise.  Past Medical History:  Diagnosis Date  . Coronary artery disease    Chronic occlusion of the RCA with left-to-right collaterals, PCI of the left circumflex/obtuse marginal, moderate nonobstructive disease of the LAD  . Hyperglycemia   . Hyperlipidemia   . Hypertension   . Lung cancer ([place])   . Myocardial infarction [facility])    Inferoposterior STEMI October 2015, PCI of the first OM branch with a drug-eluting stent    Past Surgical History:  Procedure Laterality Date  . CARDIOVERSION N/A 01/09/2018   Procedure: CARDIOVERSION;  Surgeon: Lelon Perla, MD;  Location[facility] ENDOSCOPY;  Service: Cardiovascular;  Laterality: N/A;  . COLONOSCOPY WITH PROPOFOL N/A 01/13/2015   Procedure: COLONOSCOPY WITH PROPOFOL;  Surgeon: Beryle Beams, MD;  Location: [facility] ENDOSCOPY;  Service: Endoscopy;  Laterality: N/A;  . ENTEROSCOPY N/A 01/13/2015   Procedure: ENTEROSCOPY with propofol;  Surgeon: Beryle Beams, MD;   Location: [facility] ENDOSCOPY;  Service: Endoscopy;  Laterality: N/A;  . LEFT HEART CATH Bilateral 09/03/2014   Procedure: LEFT HEART CATH;  Surgeon: Laverda Page, MD;  Location: [facility] CATH LAB;  Service: Cardiovascular;  Laterality: Bilateral;  . LOBECTOMY      Current Medications: Current Meds  Medication Sig  . Alirocumab (PRALUENT) 75 MG/ML SOAJ Inject 1 pen into the skin every 14 (fourteen) days.  . carvedilol (COREG) 25 MG tablet TAKE 1/2 (ONE-HALF) TABLET BY MOUTH TWICE DAILY WITH A MEAL  . ELIQUIS 5 MG TABS tablet Take 1 tablet by mouth twice daily  . lisinopril-hydrochlorothiazide (ZESTORETIC) 20-12.5 MG tablet Take 1 tablet by mouth daily.  . Multiple Vitamin (MULTI-VITAMIN DAILY PO) Take 1 tablet by mouth daily.  . nitroGLYCERIN (NITROSTAT) 0.4 MG SL tablet DISSOLVE ONE TABLET UNDER THE TONGUE EVERY 5 MINUTES AS NEEDED FOR CHEST PAIN.  DO NOT EXCEED A TOTAL OF 3 DOSES IN 15 MINUTES  . tadalafil (CIALIS) 10 MG tablet Take 1 tablet (10 mg total) by mouth daily as needed for erectile dysfunction.     Allergies:   Patient has no known allergies.   Social History   Socioeconomic History  . Marital status: Married    Spouse name: Not on file  . Number of children: Not on file  . Years of education: Not on file  . Highest education level: Not on file  Occupational History  . Not  on file  Tobacco Use  . Smoking status: Former Smoker    Types: Cigarettes    Quit date: 12/26/2003    Years since quitting: 16.2  . Smokeless tobacco: Never Used  Substance and Sexual Activity  . Alcohol use: No  . Drug use: No  . Sexual activity: Yes  Other Topics Concern  . Not on file  Social History Narrative   The patient is married. He is a former smoker but quit in 2001 when he was diagnosed with lung cancer.   Social Determinants of Health   Financial Resource Strain:   . Difficulty of Paying Living Expenses:   Food Insecurity:   . Worried About Charity fundraiser in the Last Year:    . Arboriculturist in the Last Year:   Transportation Needs:   . Film/video editor (Medical):   Marland Kitchen Lack of Transportation (Non-Medical):   Physical Activity:   . Days of Exercise per Week:   . Minutes of Exercise per Session:   Stress:   . Feeling of Stress :   Social Connections:   . Frequency of Communication with Friends and Family:   . Frequency of Social Gatherings with Friends and Family:   . Attends Religious Services:   . Active Member of Clubs or Organizations:   . Attends Archivist Meetings:   Marland Kitchen Marital Status:      Family History: The patient's family history includes Cancer in his father; Heart attack in his mother.  ROS:   Please see the history of present illness.    All other systems reviewed and are negative.  EKGs/Labs/Other Studies Reviewed:    EKG:  EKG is not ordered today.   Recent Labs: 10/11/2019: Hemoglobin 13.8; Platelets 270 11/15/2019: BUN 20; Creatinine, Ser 1.18; Potassium 4.3; Sodium 135 02/28/2020: ALT 16  Recent Lipid Panel    Component Value Date/Time   CHOL 185 02/28/2020 0930   TRIG 120 02/28/2020 0930   HDL 40 02/28/2020 0930   CHOLHDL 4.6 02/28/2020 0930   CHOLHDL 3.3 12/01/2015 0957   VLDL 23 12/01/2015 0957   LDLCALC 123 (H) 02/28/2020 0930    Physical Exam:    VS:  BP 140/82   Pulse 68   Ht 5\' 8"  (1.727 m)   Wt 177 lb 9.6 oz (80.6 kg)   SpO2 98%   BMI 27.00 kg/m     Wt Readings from Last 3 Encounters:  04/03/20 177 lb 9.6 oz (80.6 kg)  10/19/19 186 lb 9.6 oz (84.6 kg)  04/28/19 183 lb (83 kg)     GEN:  Well nourished, well developed in no acute distress HEENT: Normal NECK: No JVD; No carotid bruits LYMPHATICS: No lymphadenopathy CARDIAC: Irregularly irregular, no murmurs, rubs, gallops RESPIRATORY:  Clear to auscultation without rales, wheezing or rhonchi  ABDOMEN: Soft, non-tender, non-distended MUSCULOSKELETAL:  No edema; No deformity  SKIN: Warm and dry NEUROLOGIC:  Alert and oriented x  3 PSYCHIATRIC:  Normal affect   ASSESSMENT:    1. Persistent atrial fibrillation ([place])   2. Coronary artery disease involving native coronary artery of native heart without angina pectoris   3. Mixed hyperlipidemia    PLAN:    In order of problems listed above:  1. Heart rate remains well controlled on carvedilol.  Oral anticoagulation with apixaban.  No recent bleeding problems reported.  He is scheduled for colonoscopy in the near future.  Plan to follow-up with him in 1 year. 2. The patient  has no anginal symptoms.  He is statin intolerant.  He has been started on Praluent with plans for repeat labs 8 weeks after starting medication. 3. As above, now on Praluent, unable to tolerate any statin drugs due to myalgias.  Labs tentatively planned in about 8 weeks.   Medication Adjustments/Labs and Tests Ordered: Current medicines are reviewed at length with the patient today.  Concerns regarding medicines are outlined above.  No orders of the defined types were placed in this encounter.  No orders of the defined types were placed in this encounter.   Patient Instructions  Medication Instructions:  Your provider recommends that you continue on your current medications as directed. Please refer to the Current Medication list given to you today.   *If you need a refill on your cardiac medications before your next appointment, please call your pharmacy*  Follow-Up: At [facility], you and your health needs are our priority.  As part of our continuing mission to provide you with exceptional heart care, we have created designated Provider Care Teams.  These Care Teams include your primary Cardiologist (physician) and Advanced Practice Providers (APPs -  Physician Assistants and Nurse Practitioners) who all work together to provide you with the care you need, when you need it. Your next appointment:   12 month(s) The format for your next appointment:   In Person Provider:   You may see  Sherren Mocha, MD or one of the following Advanced Practice Providers on your designated Care Team:    Richardson Dopp, PA-C  Robbie Lis, [place]      Signed, Sherren Mocha, MD  04/03/2020 10:41 AM    [place]

## 2020-04-03 NOTE — Patient Instructions (Signed)

## 2020-04-11 ENCOUNTER — Telehealth: Payer: Self-pay | Admitting: Cardiovascular Disease

## 2020-04-11 NOTE — Telephone Encounter (Signed)
The patient's wife called to confirm upcoming labs are supposed to be fasting. She also states they do not feel comfortable waiting a year to see Dr. Burt Knack and would like to alternate [facility] and Dr. Cannon Kettle visits at 6 month intervals. She understands Scott's schedule is not available for November/December at this time. She will call in 2 months to schedule. She was grateful for call back.

## 2020-04-11 NOTE — Telephone Encounter (Signed)
Patient's wife is requesting for [facility], Dr. Antionette Char nurse, to call her. She states one of the things involves his cholesterol medication. She states [name] will know what she is talking about.

## 2020-05-08 ENCOUNTER — Other Ambulatory Visit: Payer: Self-pay

## 2020-05-08 ENCOUNTER — Other Ambulatory Visit: Payer: Medicare HMO | Admitting: *Deleted

## 2020-05-08 DIAGNOSIS — E782 Mixed hyperlipidemia: Secondary | ICD-10-CM

## 2020-05-08 LAB — HEPATIC FUNCTION PANEL
ALT: 15 IU/L (ref 0–44)
AST: 19 IU/L (ref 0–40)
Albumin: 3.9 g/dL (ref 3.7–4.7)
Alkaline Phosphatase: 64 IU/L (ref 48–121)
Bilirubin Total: 0.6 mg/dL (ref 0.0–1.2)
Bilirubin, Direct: 0.15 mg/dL (ref 0.00–0.40)
Total Protein: 6.8 g/dL (ref 6.0–8.5)

## 2020-05-08 LAB — LIPID PANEL
Chol/HDL Ratio: 4.1 ratio (ref 0.0–5.0)
Cholesterol, Total: 156 mg/dL (ref 100–199)
HDL: 38 mg/dL — ABNORMAL LOW (ref >39)
LDL Chol Calc (NIH): 95 mg/dL (ref 0–99)
Triglycerides: 131 mg/dL (ref 0–149)
VLDL Cholesterol Cal: 23 mg/dL (ref 5–40)

## 2020-05-11 ENCOUNTER — Telehealth: Payer: Self-pay | Admitting: Pharmacist

## 2020-05-11 DIAGNOSIS — E782 Mixed hyperlipidemia: Secondary | ICD-10-CM

## 2020-05-11 MED ORDER — EZETIMIBE 10 MG PO TABS: 10.0000 mg | ORAL_TABLET | Freq: Every day | ORAL | 0 refills | Status: DC

## 2020-05-11 NOTE — Addendum Note (Signed)
Addended by: Rollen Sox on: 05/11/2020 04:31 PM   Modules accepted: Orders

## 2020-05-11 NOTE — Telephone Encounter (Signed)
Spoke with patient's wife and went over LDL results.  Wife agreeable to starting Zetia and rechecking Lipid panel in 3 months.  Will call back with any adverse effects

## 2020-05-11 NOTE — Telephone Encounter (Signed)
Patient LDL has decreased but still above goal.  Patient is statin intolerant.  Will add on Zetia to regimen

## 2020-05-11 NOTE — Addendum Note (Signed)
Addended by: Rollen Sox on: 05/11/2020 10:59 AM   Modules accepted: Orders

## 2020-05-29 ENCOUNTER — Telehealth: Payer: Self-pay | Admitting: Cardiovascular Disease

## 2020-05-29 DIAGNOSIS — I10 Essential (primary) hypertension: Secondary | ICD-10-CM

## 2020-05-29 MED ORDER — LISINOPRIL-HYDROCHLOROTHIAZIDE 20-12.5 MG PO TABS: 1.0000 | ORAL_TABLET | Freq: Every day | ORAL | 3 refills | Status: DC

## 2020-05-29 NOTE — Telephone Encounter (Signed)
Ms. Amparan requests a refill for Zestoretic.  She also requests a 6 month visit with Richardson Dopp (Dr. Burt Knack had previously mentioned seeing the patient back in a year but she says they do not feel comfortable waiting.  Scheduled the patient for 6 month visit with SW 12/6. She was grateful for assistance.

## 2020-05-29 NOTE — Telephone Encounter (Signed)
Patient's wife calling to speak with [name] about the patient's medication. She states she does not remember the name of it.

## 2020-06-05 ENCOUNTER — Telehealth: Payer: Self-pay | Admitting: Cardiovascular Disease

## 2020-06-05 NOTE — Telephone Encounter (Signed)
Will send to our prior [place] for further review.

## 2020-06-05 NOTE — Telephone Encounter (Signed)
Pt previously aware Praluent copay was $47/month. Unsure if pt now in donut hole since he is on 2 branded meds. Will see if pt qualifies for pt assistance.

## 2020-06-05 NOTE — Telephone Encounter (Addendum)
**Note De-Identified Antonio Lawrence Obfuscation** Letter received from [facility] stating that they have approved the pts Ezetimibe tier exception. Approval good until 11/10/2020  I have notified the pts wife of this approval.  She states that she needs something done about the cost of the pts Praluent and his Eliquis and that they received a letter from [place] last Friday stating that they were denying to pay for them.  I have advised her that I am forwarding this call to our pharmacy team to address her Praluent and that I will address her Eliquis.  She states that she is going to e-mail [place] the letter they received from [place] tomorrow so we will have a better understanding of what is needed.  She thanked me for calling her.

## 2020-06-05 NOTE — Telephone Encounter (Signed)
Pt c/o medication issue:  1. Name of Medication: ezetimibe (ZETIA) 10 MG tablet  2. How are you currently taking this medication (dosage and times per day)? 1 tablet daily  3. Are you having a reaction (difficulty breathing--STAT)? no  4. What is your medication issue? [facility] calling stating the patient is requesting a tier exception for the medication.

## 2020-06-05 NOTE — Telephone Encounter (Signed)
**Note De-Identified Antonio Lawrence Obfuscation** I started a Zetia tier exception through covermymeds. Key: [id number]

## 2020-06-06 NOTE — Telephone Encounter (Signed)
I called the pt and applied for the [employer] and they were approved however the [employer] foundation is requiring an income verification which the pt has declined. They also stated that they wanted help with their eliquis as well. I am not sure hoe to help this patient if they are not willing to submit income verification. So they stated that they would just pay out of pocket for their drugs.

## 2020-06-06 NOTE — Telephone Encounter (Signed)
Unfortunately cannot make Eliquis or Praluent copays cheaper than current price through insurance if pt/his wife will not complete patient assistance for either med.

## 2020-06-06 NOTE — Telephone Encounter (Signed)
**Note De-Identified Dennies Coate Obfuscation** The pts wife called back today complaining that the pt did not need the Ezetimibe Tuer exceotion as he was only paying $19 a month for it and that they want a tier exception done on Praluent and Eliquis. I was trying to talk with her about options to lower cost of each and she all of the sudden stated "thank you for your help" and hung up.  I will attempt a Eliquis tier exception and will forward message to Allean Found, [place] in Coumadin clinic as FYI.

## 2020-06-07 ENCOUNTER — Telehealth: Payer: Self-pay

## 2020-06-07 NOTE — Telephone Encounter (Signed)
**Note De-Identified Antonio Lawrence Obfuscation** I started a tier exception through covermymeds. Key: [id number]

## 2020-06-08 NOTE — Telephone Encounter (Signed)
**Note De-Identified [place] D[employer]are Obfuscation** Letter received from [facility] stating that they are denying this Eliquis tier exception. Reason: Eliquis is already at a tier 3 which is the lowest tier for a name brand med per Medicare part D.  The pt is aware.

## 2020-07-12 ENCOUNTER — Telehealth: Payer: Self-pay | Admitting: Cardiovascular Disease

## 2020-07-12 DIAGNOSIS — E782 Mixed hyperlipidemia: Secondary | ICD-10-CM

## 2020-07-12 MED ORDER — EZETIMIBE 10 MG PO TABS: 10.0000 mg | ORAL_TABLET | Freq: Every day | ORAL | 11 refills | Status: DC

## 2020-07-12 MED ORDER — TADALAFIL 10 MG PO TABS: 10.0000 mg | ORAL_TABLET | Freq: Every day | ORAL | 11 refills | Status: DC | PRN

## 2020-07-12 NOTE — Telephone Encounter (Signed)
Antonio Lawrence is calling requesting Antonio Lawrence call her in regards to her husbands cholesterol medication. She states she doesn't remember the name of the medication, but it is not in regards to the one that is a shot. Please advise.

## 2020-07-12 NOTE — Telephone Encounter (Signed)
The patient's wife requests refills on Zetia and Cialis.  Called in to respective pharmacies.  She states labs were drawn at PCP recently. She will mail to the office for review. She was grateful for assistance.

## 2020-08-02 ENCOUNTER — Telehealth: Payer: Self-pay | Admitting: Cardiovascular Disease

## 2020-08-02 ENCOUNTER — Encounter: Payer: Self-pay | Admitting: Cardiovascular Disease

## 2020-08-02 NOTE — Telephone Encounter (Signed)
Wife of the patient called. The Wife would like to speak with [name] about the patient's Cholesterol medication. Please call the wife

## 2020-08-02 NOTE — Telephone Encounter (Signed)
error 

## 2020-08-03 NOTE — Telephone Encounter (Signed)
Follow up:    Patient wife calling to speak with the nurse. Please call patient wife back

## 2020-08-03 NOTE — Telephone Encounter (Signed)
Left message to call back  

## 2020-08-03 NOTE — Telephone Encounter (Signed)
Diane asked if 10/4 lipid panel is needed given labs were just drawn in August.  Cancelled labs since last LDL was low.  Tommy will come fasting to appointment with Nicki Reaper in case he wants a repeat. She was grateful for assistance.

## 2020-08-03 NOTE — Telephone Encounter (Signed)
Patient's wife returning call. 

## 2020-08-07 ENCOUNTER — Telehealth: Payer: Self-pay | Admitting: Cardiovascular Disease

## 2020-08-07 NOTE — Telephone Encounter (Signed)
Patient's wife called and wanted to speak with Dr. Burt Knack or nurse about his new cholesterol medication that he's taking. Please call back

## 2020-08-07 NOTE — Telephone Encounter (Signed)
Confirmed with Antonio Lawrence the patient is to take Zetia. She was grateful for call.

## 2020-08-07 NOTE — Telephone Encounter (Signed)
Left message to call back  

## 2020-08-14 ENCOUNTER — Other Ambulatory Visit: Payer: Medicare HMO

## 2020-08-16 ENCOUNTER — Telehealth: Payer: Self-pay | Admitting: Cardiovascular Disease

## 2020-08-16 NOTE — Telephone Encounter (Signed)
Patient's wife calling to speak to [facility] about one of the patient's medications. She did not remember the name of the medication.

## 2020-08-16 NOTE — Telephone Encounter (Signed)
Left message to call back  

## 2020-08-16 NOTE — Telephone Encounter (Signed)
Ms. Bauer called to confirm the patient is not to take pravastatin since the pharmacy called and said a refill is ready for pickup.  Reiterated to her he is NOT to take pravastatin. Confirmed he is to continue Praluent and Zetia. She was grateful for assistance.

## 2020-08-16 NOTE — Telephone Encounter (Signed)
Antonio Lawrence is returning Antonio Lawrence call.

## 2020-10-16 ENCOUNTER — Telehealth: Payer: Medicare HMO | Admitting: Physician Assistant

## 2020-10-26 ENCOUNTER — Telehealth: Payer: Self-pay | Admitting: Cardiovascular Disease

## 2020-10-26 NOTE — Telephone Encounter (Signed)
The patient's wife requested to schedule appointment with Dr. Burt Knack in May. 6 month appointment scheduled. She was grateful for assistance.

## 2020-10-26 NOTE — Telephone Encounter (Signed)
Pt c/o medication issue:  1. Name of Medication: does not know  2. How are you currently taking this medication (dosage and times per day)? Does not know  3. Are you having a reaction (difficulty breathing--STAT)? no  4. What is your medication issue? Patient's wife states she want's to speak with [name] about one of his cholesterol medications. She states she does not remember the name of it.

## 2020-10-29 NOTE — Progress Notes (Signed)
Cardiology Office Note:    Date:  10/30/2020   ID:  Antonio Lawrence, DOB 05/18/1946, MRN [id number]  PCP:  Antonio Sacramento, MD  [facility] Cardiologist:  Sherren Mocha, MD   [facility] Electrophysiologist:  None   Referring MD: Antonio Sacramento, MD   Chief Complaint:  Follow-up (CAD, atrial fibrillation)    Patient Profile:    Antonio Lawrence is a 74 y.o. male with:   Coronary artery disease  ? S/p inf-post MI in 08/2014 >> PCI: DES to OM1  Persistent AFib ? S/p DCCV 01/2018 >> ERAF ? Rate control strategy ? 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 RCA mid occluded, L-R collaterals; LM 20-30%; LCx with OM1 proximal 99%; LAD mid intramyocardial bridging, mid 40%,distal 70-80%;  PCI: Successful PTCA and stenting of the Proximal OM1with2.5 x 18 mm Xience Alpine DES.  History of Present Illness:    Mr. Arizpe was last seen by Dr. Burt Knack in 5/21.  He returns for f/u.  He is here alone.  Since last seen, he has done well.  He has not had chest discomfort, significant shortness of breath, orthopnea, leg edema.  He has not had melena, hematochezia, hematuria.      Past Medical History:  Diagnosis Date  . Coronary artery disease    Chronic occlusion of the RCA with left-to-right collaterals, PCI of the left circumflex/obtuse marginal, moderate nonobstructive disease of the LAD  . Hyperglycemia   . Hyperlipidemia   . Hypertension   . Lung cancer ([place])   . Myocardial infarction [facility])    Inferoposterior STEMI  October 2015, PCI of the first OM branch with a drug-eluting stent    Current Medications: Current Meds  Medication Sig  . carvedilol (COREG) 25 MG tablet TAKE 1/2 (ONE-HALF) TABLET BY MOUTH TWICE DAILY WITH A MEAL  . ELIQUIS 5 MG TABS tablet Take 1 tablet by mouth twice daily  . lisinopril-hydrochlorothiazide (ZESTORETIC) 20-12.5 MG tablet Take 1 tablet by mouth daily.  . Multiple Vitamin (MULTI-VITAMIN DAILY PO) Take 1 tablet by mouth daily.  . nitroGLYCERIN (NITROSTAT) 0.4 MG SL tablet DISSOLVE ONE TABLET UNDER THE TONGUE EVERY 5 MINUTES AS NEEDED FOR CHEST PAIN.  DO NOT EXCEED A TOTAL OF 3 DOSES IN 15 MINUTES  . tadalafil (CIALIS) 10 MG tablet Take 10 mg by mouth daily as needed for erectile dysfunction.  . [DISCONTINUED] Alirocumab (PRALUENT) 75 MG/ML SOAJ Inject 1 pen into the skin every 14 (fourteen) days.     Allergies:   Patient has no known allergies.   Social History   Tobacco Use  . Smoking status: Former Smoker    Types: Cigarettes    Quit date: 12/26/2003    Years since quitting: 16.8  . Smokeless tobacco: Never Used  Vaping Use  . Vaping Use: Never used  Substance Use Topics  . Alcohol use: No  . Drug use: No     Family Hx: The patient's family history includes Cancer in his father; Heart attack in his  mother.  Review of Systems  Gastrointestinal: Negative for hematochezia.  Genitourinary: Negative for hematuria.     EKGs/Labs/Other Test Reviewed:    EKG:  EKG is   ordered today.  The ekg ordered today demonstrates atrial fibrillation, HR 61, normal axis, nonspecific ST-T wave changes, QTC 394  Recent Labs: 11/15/2019: BUN 20; Creatinine, Ser 1.18; Potassium 4.3; Sodium 135 05/08/2020: ALT 15   Recent Lipid Panel Lab Results  Component Value Date/Time   CHOL 156 05/08/2020 09:34 AM   TRIG 131 05/08/2020 09:34 AM   HDL 38 (L) 05/08/2020 09:34 AM   CHOLHDL 4.1 05/08/2020 09:34 AM   CHOLHDL 3.3 12/01/2015 09:57 AM   LDLCALC 95 05/08/2020 09:34 AM     Labs obtained through [facility] - personally reviewed and interpreted: 07/04/20: LDL 61, TC 123, TG 94, HDL 40, K+ 4.5, Cr 1.10, ALT 15, Hgb 14   Risk Assessment/Calculations:     CHA2DS2-VASc Score = 3  This indicates a 3.2% annual risk of stroke. The patient's score is based upon: CHF History: No HTN History: Yes Diabetes History: No Stroke History: No Vascular Disease History: Yes Age Score: 1 Gender Score: 0     Physical Exam:    VS:  BP (!) 160/62   Pulse 61   Ht 5\' 8"  (1.727 m)   Wt 177 lb (80.3 kg)   SpO2 97%   BMI 26.91 kg/m     Wt Readings from Last 3 Encounters:  10/30/20 177 lb (80.3 kg)  04/03/20 177 lb 9.6 oz (80.6 kg)  10/19/19 186 lb 9.6 oz (84.6 kg)     Constitutional:      Appearance: Healthy appearance. Not in distress.  Neck:     Vascular: JVD normal.  Pulmonary:     Effort: Pulmonary effort is normal.     Breath sounds: No wheezing. No rales.  Cardiovascular:     Normal rate. Irregularly irregular rhythm. Normal S1. Normal S2.     Murmurs: There is no murmur.  Edema:    Peripheral edema absent.  Abdominal:     Palpations: Abdomen is soft. There is no hepatomegaly.  Skin:    General: Skin is warm and dry.  Neurological:     General: No focal deficit present.     Mental Status: Alert and oriented to person, place and time.     Cranial Nerves: Cranial nerves are intact.      ASSESSMENT & PLAN:    1. Coronary artery disease involving native coronary artery of native heart without angina pectoris s/p inferoposterior myocardial infarction in 2015 treated with DES to the OM1.  He has a chronically occluded RCA.  He is doing well without anginal symptoms.  He is not on antiplatelet therapy as he is on Apixaban.  Continue Alirocumab, ezetimibe, lisinopril.  Follow-up in May as planned with Dr. Burt Knack.  2. Persistent atrial fibrillation (HCC) Rate is controlled.  He is tolerating this well without symptoms.  He is tolerating  anticoagulation.  Recent hemoglobin, creatinine.  Continue current dose of Apixaban.  3. Essential hypertension Blood pressure elevated today.  He notes fairly optimal blood pressures at home.  I have asked him to continue to monitor this and let us know if his BP is consistently above 130/80  4. Mixed hyperlipidemia LDL optimal on most recent lab work.  Continue current Rx.     Dispo:  Return in 20 weeks (on 03/19/2021) for Scheduled Follow Up, w/ Dr. Burt Knack.   Medication Adjustments/Labs and  Tests Ordered: Current medicines are reviewed at length with the patient today.  Concerns regarding medicines are outlined above.  Tests Ordered: Orders Placed This Encounter  Procedures  . EKG 12-Lead   Medication Changes: Meds ordered this encounter  Medications  . Alirocumab (PRALUENT) 75 MG/ML SOAJ    Sig: Inject 1 pen into the skin every 14 (fourteen) days.    Dispense:  2 mL    Refill:  12    Dispense 2 pens  . ezetimibe (ZETIA) 10 MG tablet    Sig: Take 1 tablet (10 mg total) by mouth daily.    Dispense:  30 tablet    Refill:  [address] Richardson Dopp, [place]  10/30/2020 10:23 AM    [facility] [place], [place], [place]  [address] Phone: [phone]; Fax: [phone]

## 2020-10-30 ENCOUNTER — Other Ambulatory Visit: Payer: Self-pay

## 2020-10-30 ENCOUNTER — Encounter: Payer: Self-pay | Admitting: Physician Assistant

## 2020-10-30 ENCOUNTER — Ambulatory Visit: Payer: Medicare HMO | Admitting: Physician Assistant

## 2020-10-30 VITALS — BP 160/62 | HR 61 | Ht 68.0 in | Wt 177.0 lb

## 2020-10-30 DIAGNOSIS — I2510 Atherosclerotic heart disease of native coronary artery without angina pectoris: Secondary | ICD-10-CM | POA: Diagnosis not present

## 2020-10-30 DIAGNOSIS — I10 Essential (primary) hypertension: Secondary | ICD-10-CM | POA: Diagnosis not present

## 2020-10-30 DIAGNOSIS — I4819 Other persistent atrial fibrillation: Secondary | ICD-10-CM

## 2020-10-30 DIAGNOSIS — E782 Mixed hyperlipidemia: Secondary | ICD-10-CM | POA: Diagnosis not present

## 2020-10-30 MED ORDER — EZETIMIBE 10 MG PO TABS: 10.0000 mg | ORAL_TABLET | Freq: Every day | ORAL | 11 refills | Status: DC

## 2020-10-30 MED ORDER — PRALUENT 75 MG/ML ~~LOC~~ SOAJ: 1.0000 "pen " | SUBCUTANEOUS | 12 refills | Status: DC

## 2020-10-30 NOTE — Patient Instructions (Addendum)
Medication Instructions:  Your physician recommends that you continue on your current medications as directed. Please refer to the Current Medication list given to you today.  *If you need a refill on your cardiac medications before your next appointment, please call your pharmacy*  Lab Work: None ordered today  If you have labs (blood work) drawn today and your tests are completely normal, you will receive your results only by: Marland Kitchen MyChart Message (if you have MyChart) OR . A paper copy in the mail If you have any lab test that is abnormal or we need to change your treatment, we will call you to review the results.  Testing/Procedures: None ordered today  Follow-Up: Keep follow up on 03/19/2021 with Sherren Mocha, MD  Other Instructions Call the office if your blood pressure is higher than 130/80 consistently

## 2020-11-16 ENCOUNTER — Telehealth: Payer: Self-pay | Admitting: Cardiovascular Disease

## 2020-11-16 NOTE — Telephone Encounter (Signed)
Left message to call back if assistance is needed.

## 2020-11-16 NOTE — Telephone Encounter (Signed)
Diane is calling requesting to speak with [name] in regards to Antonio Lawrence's cholesterol medication. Please advise.

## 2020-11-16 NOTE — Telephone Encounter (Signed)
The patient's wife reports the patient is uncomfortable waiting to having his cholesterol checked for a year.last labs were in August 2021.  He will come in on Monday 11/20/20 for fasting lipid panel. She was grateful for assistance.

## 2020-11-20 ENCOUNTER — Other Ambulatory Visit: Payer: Medicare HMO | Admitting: *Deleted

## 2020-11-20 ENCOUNTER — Other Ambulatory Visit: Payer: Self-pay

## 2020-11-20 DIAGNOSIS — E782 Mixed hyperlipidemia: Secondary | ICD-10-CM

## 2020-11-20 LAB — LIPID PANEL
Chol/HDL Ratio: 3.5 ratio (ref 0.0–5.0)
Cholesterol, Total: 149 mg/dL (ref 100–199)
HDL: 43 mg/dL (ref >39)
LDL Chol Calc (NIH): 82 mg/dL (ref 0–99)
Triglycerides: 136 mg/dL (ref 0–149)
VLDL Cholesterol Cal: 24 mg/dL (ref 5–40)

## 2020-11-23 ENCOUNTER — Other Ambulatory Visit: Payer: Self-pay | Admitting: Cardiovascular Disease

## 2020-11-23 ENCOUNTER — Other Ambulatory Visit: Payer: Self-pay | Admitting: Physician Assistant

## 2020-11-23 DIAGNOSIS — I10 Essential (primary) hypertension: Secondary | ICD-10-CM

## 2020-11-23 DIAGNOSIS — E785 Hyperlipidemia, unspecified: Secondary | ICD-10-CM

## 2020-11-23 NOTE — Telephone Encounter (Signed)
Eliquis 5mg  refill request received. Patient is 75 years old, weight-80.3kg, Crea-1.10 on 07/04/2020 via scanned labs, Diagnosis-Afib, and last seen by [place] on 10/30/20. Dose is appropriate based on dosing criteria. Will send in refill to requested pharmacy.

## 2021-03-12 ENCOUNTER — Telehealth: Payer: Self-pay | Admitting: Cardiovascular Disease

## 2021-03-12 NOTE — Telephone Encounter (Signed)
Confirmed with Diane she is allowed to accompany the patient to his visit. She will bring his recent blood work results as well. She was grateful for call and agrees with plan.

## 2021-03-12 NOTE — Telephone Encounter (Signed)
Diane is calling with questions about her husband's treatmeant.Please advise

## 2021-03-19 ENCOUNTER — Encounter: Payer: Self-pay | Admitting: Cardiovascular Disease

## 2021-03-19 ENCOUNTER — Other Ambulatory Visit: Payer: Self-pay

## 2021-03-19 ENCOUNTER — Ambulatory Visit: Payer: Medicare HMO | Admitting: Cardiovascular Disease

## 2021-03-19 VITALS — BP 168/80 | HR 76 | Ht 68.0 in | Wt 181.3 lb

## 2021-03-19 DIAGNOSIS — I2510 Atherosclerotic heart disease of native coronary artery without angina pectoris: Secondary | ICD-10-CM | POA: Diagnosis not present

## 2021-03-19 DIAGNOSIS — I10 Essential (primary) hypertension: Secondary | ICD-10-CM

## 2021-03-19 DIAGNOSIS — E782 Mixed hyperlipidemia: Secondary | ICD-10-CM | POA: Diagnosis not present

## 2021-03-19 DIAGNOSIS — I4819 Other persistent atrial fibrillation: Secondary | ICD-10-CM

## 2021-03-19 NOTE — Patient Instructions (Signed)
Medication Instructions:  Your provider recommends that you continue on your current medications as directed. Please refer to the Current Medication list given to you today.   *If you need a refill on your cardiac medications before your next appointment, please call your pharmacy*  Testing/Procedures: Your provider has requested that you have an echocardiogram prior to your visit next year. Echocardiography is a painless test that uses sound waves to create images of your heart. It provides your doctor with information about the size and shape of your heart and how well your heart's chambers and valves are working. This procedure takes approximately one hour. There are no restrictions for this procedure.    Follow-Up: At [facility], you and your health needs are our priority.  As part of our continuing mission to provide you with exceptional heart care, we have created designated Provider Care Teams.  These Care Teams include your primary Cardiologist (physician) and Advanced Practice Providers (APPs -  Physician Assistants and Nurse Practitioners) who all work together to provide you with the care you need, when you need it. Your next appointment:   12 month(s) The format for your next appointment:   In Person Provider:   You may see Sherren Mocha, MD or one of the following Advanced Practice Providers on your designated Care Team:    [place]pp, PA-C  [place], [place]

## 2021-03-19 NOTE — Progress Notes (Signed)
Cardiology Office Note:    Date:  03/19/2021   ID:  Antonio Lawrence, DOB 12/06/1945, MRN [id number]  PCP:  Christain Sacramento, MD   [facility] Cardiologist:  Sherren Mocha, MD     Referring MD: Christain Sacramento, MD   Chief Complaint  Patient presents with  . Coronary Artery Disease    History of Present Illness:    Antonio Lawrence is a 75 y.o. male with a hx of:  Coronary artery disease  ? S/p inf-post MI in 08/2014 >> PCI: DES to OM1  Persistent AFib ? S/p DCCV 01/2018 >> ERAF ? Rate control strategy ? 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  He was last seen by Richardson Dopp in December 2021.  At that time he was doing well with no cardiovascular symptoms.  He has medical problems were stable and his medical program was continued.  Today, he denies symptoms of palpitations, chest pain, shortness of breath, orthopnea, PND, lower extremity edema, dizziness, or syncope.  He brings in recent lab work from 02/12/2021.  Cholesterol is 137, LDL is 70, triglycerides 137, HDL 43.  AST is 15, ALT is 15, creatinine 1.15, potassium 4.3, hemoglobin 14.5, platelet count 252,000. He reports home BP has been in a good range, running less than 140 mmHg. He has a hx of white coat HTN and adds that the drive in always increases his BP significantly.   Past Medical History:  Diagnosis Date  . Coronary artery disease    Chronic occlusion of the RCA with left-to-right collaterals, PCI of the left circumflex/obtuse marginal, moderate nonobstructive disease of the LAD  . Hyperglycemia   . Hyperlipidemia   . Hypertension   . Lung cancer ([place])   . Myocardial infarction [facility])    Inferoposterior STEMI October 2015, PCI of the first OM branch with a drug-eluting stent    Past Surgical History:  Procedure Laterality Date  . CARDIOVERSION  N/A 01/09/2018   Procedure: CARDIOVERSION;  Surgeon: Lelon Perla, MD;  [facility] ENDOSCOPY;  Service: Cardiovascular;  Laterality: N/A;  . COLONOSCOPY WITH PROPOFOL N/A 01/13/2015   Procedure: COLONOSCOPY WITH PROPOFOL;  Surgeon: Beryle Beams, MD;  Location: [facility] ENDOSCOPY;  Service: Endoscopy;  Laterality: N/A;  . ENTEROSCOPY N/A 01/13/2015   Procedure: ENTEROSCOPY with propofol;  Surgeon: Beryle Beams, MD;  Location: [facility] ENDOSCOPY;  Service: Endoscopy;  Laterality: N/A;  . LEFT HEART CATH Bilateral 09/03/2014   Procedure: LEFT HEART CATH;  Surgeon: Laverda Page, MD;  Location: [facility] CATH LAB;  Service: Cardiovascular;  Laterality: Bilateral;  . LOBECTOMY      Current Medications: Current Meds  Medication Sig  . Alirocumab (PRALUENT) 75 MG/ML SOAJ Inject 1 pen into the skin every 14 (fourteen) days.  . carvedilol (COREG) 25 MG tablet TAKE 1/2 (ONE-HALF) TABLET BY MOUTH TWICE DAILY WITH A MEAL  . ELIQUIS 5 MG TABS tablet Take 1 tablet by mouth twice daily  . ezetimibe (ZETIA) 10 MG tablet Take 1 tablet (10 mg total) by mouth daily.  Marland Kitchen lisinopril-hydrochlorothiazide (ZESTORETIC) 20-12.5 MG tablet Take 1 tablet by mouth daily.  . Multiple Vitamin (MULTI-VITAMIN DAILY PO) Take 1 tablet by mouth daily.  . nitroGLYCERIN (NITROSTAT) 0.4 MG SL tablet DISSOLVE ONE TABLET UNDER THE TONGUE EVERY 5 MINUTES AS NEEDED FOR CHEST PAIN.  DO NOT EXCEED A TOTAL OF 3 DOSES IN 15 MINUTES  . tadalafil (CIALIS) 10 MG tablet Take 10 mg by mouth daily as needed for erectile dysfunction.  . tadalafil (CIALIS) 20 MG tablet Take 20 mg by mouth daily as needed.  . triamcinolone cream (KENALOG) 0.1 % Apply topically.     Allergies:   Patient has no known allergies.   Social History   Socioeconomic History  . Marital status: Married    Spouse name: Not on file  . Number of children: Not on file  . Years of education: Not on file  . Highest education level: Not on file  Occupational History  . Not on  file  Tobacco Use  . Smoking status: Former Smoker    Types: Cigarettes    Quit date: 12/26/2003    Years since quitting: 17.2  . Smokeless tobacco: Never Used  Vaping Use  . Vaping Use: Never used  Substance and Sexual Activity  . Alcohol use: No  . Drug use: No  . Sexual activity: Yes  Other Topics Concern  . Not on file  Social History Narrative   The patient is married. He is a former smoker but quit in 2001 when he was diagnosed with lung cancer.   Social Determinants of Health   Financial Resource Strain: Not on file  Food Insecurity: Not on file  Transportation Needs: Not on file  Physical Activity: Not on file  Stress: Not on file  Social Connections: Not on file     Family History: The patient's family history includes Cancer in his father; Heart attack in his mother.  ROS:   Please see the history of present illness.    All other systems reviewed and are negative.  EKGs/Labs/Other Studies Reviewed:    The following studies were reviewed today: Echo 12/01/2017: Study Conclusions   - Left ventricle: The cavity size was normal. Wall thickness was  increased in a pattern of mild LVH. Systolic function was normal.  The estimated ejection fraction was in the range of 55% to 60%.  - Mitral valve: There was mild regurgitation.   EKG:  EKG is not ordered today.    Recent Labs: 05/08/2020: ALT 15  Recent Lipid Panel    Component Value Date/Time   CHOL 149 11/20/2020 0941   TRIG 136 11/20/2020 0941   HDL 43 11/20/2020 0941   CHOLHDL 3.5 11/20/2020 0941   CHOLHDL 3.3 12/01/2015 0957   VLDL 23 12/01/2015 0957   LDLCALC 82 11/20/2020 0941     Risk Assessment/Calculations:    CHA2DS2-VASc Score = 3  This indicates a 3.2% annual risk of stroke. The patient's score is based upon: CHF History: No HTN History: Yes Diabetes History: No Stroke History: No Vascular Disease History: Yes Age Score: 1 Gender Score: 0      Physical Exam:    VS:  BP  (!) 168/80   Pulse 76   Ht 5\' 8"  (1.727 m)   Wt 181 lb 4.8 oz (82.2 kg)   SpO2 97%   BMI 27.57 kg/m     Wt Readings from Last 3 Encounters:  03/19/21 181 lb 4.8 oz (82.2 kg)  10/30/20 177 lb (80.3 kg)  04/03/20 177 lb 9.6 oz (80.6 kg)     GEN:  Well nourished, well developed in no acute distress HEENT: Normal NECK: No JVD; No carotid bruits LYMPHATICS: No lymphadenopathy CARDIAC: irregularly irregular, no murmurs, rubs, gallops RESPIRATORY:  Clear to auscultation without rales, wheezing or  rhonchi  ABDOMEN: Soft, non-tender, non-distended MUSCULOSKELETAL:  No edema; No deformity  SKIN: Warm and dry NEUROLOGIC:  Alert and oriented x 3 PSYCHIATRIC:  Normal affect   ASSESSMENT:    1. Coronary artery disease involving native coronary artery of native heart without angina pectoris   2. Persistent atrial fibrillation ([place])   3. Essential hypertension   4. Mixed hyperlipidemia    PLAN:    In order of problems listed above:  1. The patient is stable with no symptoms of angina.  He continues on a beta-blocker.  He is not on aspirin because of chronic oral anticoagulation with apixaban.  We will continue current medicines. 2. The patient has been in atrial fibrillation for 3 years.  He tolerates this well with good rate control and no associated symptoms.  We will update an echocardiogram prior to his next office visit in 1 year.  He will continue on anticoagulation with apixaban and rate control with carvedilol. 3. Blood pressure is well controlled on home readings.  He does have an elevated blood pressure in the office today with a history of whitecoat hypertension.  I asked him to monitor his blood pressure at home at least a few times per week and notify us if he is seeing any readings greater than 140 mmHg. 4. Lipids reviewed as above with LDL cholesterol of 70 mg/dL on Praluent and ezetimibe.  He is statin intolerant.  No changes are made today.  Medication Adjustments/Labs and  Tests Ordered: Current medicines are reviewed at length with the patient today.  Concerns regarding medicines are outlined above.  Orders Placed This Encounter  Procedures  . ECHOCARDIOGRAM COMPLETE   No orders of the defined types were placed in this encounter.   Patient Instructions  Medication Instructions:  Your provider recommends that you continue on your current medications as directed. Please refer to the Current Medication list given to you today.   *If you need a refill on your cardiac medications before your next appointment, please call your pharmacy*  Testing/Procedures: Your provider has requested that you have an echocardiogram prior to your visit next year. Echocardiography is a painless test that uses sound waves to create images of your heart. It provides your doctor with information about the size and shape of your heart and how well your heart's chambers and valves are working. This procedure takes approximately one hour. There are no restrictions for this procedure.    Follow-Up: At [facility], you and your health needs are our priority.  As part of our continuing mission to provide you with exceptional heart care, we have created designated Provider Care Teams.  These Care Teams include your primary Cardiologist (physician) and Advanced Practice Providers (APPs -  Physician Assistants and Nurse Practitioners) who all work together to provide you with the care you need, when you need it. Your next appointment:   12 month(s) The format for your next appointment:   In Person Provider:   You may see Sherren Mocha, MD or one of the following Advanced Practice Providers on your designated Care Team:    Richardson Dopp, PA-C  Robbie Lis, [place]    Signed, Sherren Mocha, MD  03/19/2021 5:22 PM    [place]

## 2021-03-20 ENCOUNTER — Telehealth: Payer: Self-pay | Admitting: Cardiovascular Disease

## 2021-03-20 NOTE — Telephone Encounter (Signed)
Left message to call back  

## 2021-03-20 NOTE — Telephone Encounter (Signed)
Patient's wife states she has a question about the patient's appointment yesterday and wants to speak with [facility].

## 2021-03-21 NOTE — Telephone Encounter (Signed)
The patient's wife requested to review recent office visit. All questions answered.

## 2021-03-21 NOTE — Telephone Encounter (Signed)
Diane is returning call

## 2021-03-21 NOTE — Telephone Encounter (Signed)
PT's wife is returning a call

## 2021-03-21 NOTE — Telephone Encounter (Signed)
Left message to call back  

## 2021-04-17 ENCOUNTER — Telehealth: Payer: Self-pay | Admitting: Cardiovascular Disease

## 2021-04-17 MED ORDER — NITROGLYCERIN 0.4 MG SL SUBL: 0.4000 mg | SUBLINGUAL_TABLET | SUBLINGUAL | 2 refills | Status: DC | PRN

## 2021-04-17 NOTE — Telephone Encounter (Signed)
Patient's wife states she is returning a call from [place]

## 2021-04-17 NOTE — Telephone Encounter (Signed)
The patient's wife reports his NTG is old and wants to know if he needs a refill. Informed her that NTG should not be used if expired or starting to crumble. Refills sent to pharmacy.

## 2021-04-17 NOTE — Telephone Encounter (Signed)
New Message:     Pt's wife called and said she would like for [facility] to call her. She said to tell her it is about his Nitroglycerin. I asked did he need it called in and she said no.

## 2021-05-08 ENCOUNTER — Telehealth: Payer: Self-pay | Admitting: Cardiovascular Disease

## 2021-05-08 DIAGNOSIS — I10 Essential (primary) hypertension: Secondary | ICD-10-CM

## 2021-05-08 NOTE — Telephone Encounter (Signed)
Encounter not needed

## 2021-05-08 NOTE — Telephone Encounter (Signed)
BP  148/85 HR 57 It's been in the 140/80's for the last week and a half. Was told to call in with readings to see if changes need to be made to medications. Wife says BP runs higher when the patient is in office, so she wanted to send in some home readings. Has not missed any medications.   Wife has questions about Alirocumab. Will forward to Dr. Burt Knack and PharmD for advisement.

## 2021-05-08 NOTE — Telephone Encounter (Addendum)
Pt switching to [facility]- will need PA for Praluent. Plan goes into affect 7/1. Will do PA on or after 7/1  Rx BIN: [id number] RX PCN: PartD RX Grp: [id number] ID: [id number]  Praluent is non-formulary for [facility]TA. Repatha is tier 3. I tried to explain to wife that they will not cover praluent if he has not tried Repatha but she insists that Dr. Burt Knack told her that Praluent is the only thing he can take. I tried to explain to her that they are the same drug class and not a statin, but she insisted the women told her that all we had to fill out the form.  I will do the PA, but it will be denied Dr. Burt Knack would you mind telling wife that it is ok to take Repatha?

## 2021-05-08 NOTE — Telephone Encounter (Signed)
  Wife is calling because Antonio Lawrence had asked her to call with BP readings for her husband. She does not have the readings with her at this time but she does have them at her office and will be going back there. She would like a nurse to give her a call back. She also states that she needs to discuss her husbands Alirocumab (Ranlo) 59 MG/ML SOAJ but wasn't clear about what it was regarding.

## 2021-05-10 MED ORDER — LISINOPRIL-HYDROCHLOROTHIAZIDE 20-12.5 MG PO TABS
2.0000 | ORAL_TABLET | Freq: Every day | ORAL | 3 refills | Status: DC
Start: 2021-05-10 — End: 2022-03-25

## 2021-05-10 NOTE — Telephone Encounter (Signed)
Yes - please let her know I have reviewed these notes and of course it is fine to take [place] or Praluent. Thank you

## 2021-05-10 NOTE — Telephone Encounter (Signed)
Spoke with the patient's wife and advised her that Dr. Burt Knack says that it is fine for the patient to take [place]. She verbalized understanding.   She would also like Dr. Burt Knack to know that the patient's blood pressure has been running in the 140s/80s. She would like to know if any medications need adjusting.

## 2021-05-10 NOTE — Telephone Encounter (Signed)
Patient's wife is following up requesting advisement. Please return call with update when able.

## 2021-05-10 NOTE — Telephone Encounter (Signed)
Spoke with the patient's wife and advised her of medication increase per Dr. Burt Knack. Rx has been sent in. Labs have been ordered and scheduled.

## 2021-05-10 NOTE — Telephone Encounter (Signed)
Thank you.  I would recommend increasing lisinopril/HCTZ to 40/25 mg daily.  Please check a metabolic panel in 2 weeks.

## 2021-05-11 ENCOUNTER — Telehealth: Payer: Self-pay | Admitting: Cardiovascular Disease

## 2021-05-11 DIAGNOSIS — E785 Hyperlipidemia, unspecified: Secondary | ICD-10-CM

## 2021-05-11 MED ORDER — REPATHA SURECLICK 140 MG/ML ~~LOC~~ SOAJ: 1.0000 "pen " | SUBCUTANEOUS | 11 refills | Status: DC

## 2021-05-11 NOTE — Telephone Encounter (Signed)
Rx already sent. See other phone note from today for more details

## 2021-05-11 NOTE — Telephone Encounter (Signed)
I spoke with patient's wife. Patient took last dose of Praluent yesterday.  Needs Repatha prescription sent to pharmacy.  Wife is asking when patient will need follow up lab work after change.  She would like to come to our office for lab work

## 2021-05-11 NOTE — Telephone Encounter (Signed)
Patient's wife states she was told the patient will need lab work in October since he was started on a new cholesterol medication. She states she called the patient's PCP, Dr. Kathryne Eriksson at [facility] to schedule his labs, but they need an order sent to them.

## 2021-05-11 NOTE — Telephone Encounter (Signed)
*  STAT* If patient is at the pharmacy, call can be transferred to refill team.   1. Which medications need to be refilled? (please list name of each medication and dose if known) Repatha   2. Which pharmacy/location (including street and city if local pharmacy) is medication to be sent to? [facility]   3. Do they need a 30 day or 90 day supply? Not sure

## 2021-05-11 NOTE — Telephone Encounter (Signed)
Prior authorization for Repatha submitted this AM. Will have to wait for approval before sending Rx to pharmacy

## 2021-05-11 NOTE — Telephone Encounter (Signed)
PA approved. Rx send into pharmacy. Will need labs prior to 08/09/21 since that when PA expires. Pt scheduled for 9/26. Spoke with wife and all questions answered.

## 2021-05-28 ENCOUNTER — Other Ambulatory Visit: Payer: PPO | Admitting: *Deleted

## 2021-05-28 ENCOUNTER — Other Ambulatory Visit: Payer: Self-pay

## 2021-05-28 DIAGNOSIS — I10 Essential (primary) hypertension: Secondary | ICD-10-CM

## 2021-05-28 LAB — BASIC METABOLIC PANEL
BUN/Creatinine Ratio: 17 (ref 10–24)
BUN: 19 mg/dL (ref 8–27)
CO2: 25 mmol/L (ref 20–29)
Calcium: 9.2 mg/dL (ref 8.6–10.2)
Chloride: 101 mmol/L (ref 96–106)
Creatinine, Ser: 1.1 mg/dL (ref 0.76–1.27)
Glucose: 101 mg/dL — ABNORMAL HIGH (ref 65–99)
Potassium: 4.2 mmol/L (ref 3.5–5.2)
Sodium: 140 mmol/L (ref 134–144)
eGFR: 70 mL/min/{1.73_m2} (ref >59)

## 2021-06-06 DIAGNOSIS — I4819 Other persistent atrial fibrillation: Secondary | ICD-10-CM | POA: Diagnosis not present

## 2021-06-06 DIAGNOSIS — I10 Essential (primary) hypertension: Secondary | ICD-10-CM | POA: Diagnosis not present

## 2021-06-06 DIAGNOSIS — I2510 Atherosclerotic heart disease of native coronary artery without angina pectoris: Secondary | ICD-10-CM | POA: Diagnosis not present

## 2021-06-06 DIAGNOSIS — E7800 Pure hypercholesterolemia, unspecified: Secondary | ICD-10-CM | POA: Diagnosis not present

## 2021-06-20 ENCOUNTER — Telehealth: Payer: Self-pay | Admitting: Cardiovascular Disease

## 2021-06-20 MED ORDER — APIXABAN 5 MG PO TABS
5.0000 mg | ORAL_TABLET | Freq: Two times a day (BID) | ORAL | 5 refills | Status: DC
Start: 2021-06-20 — End: 2021-12-20

## 2021-06-20 NOTE — Telephone Encounter (Signed)
Pt last saw Dr Burt Knack 03/19/21, last labs 05/28/21 Creat 1.10, age 75, weight 82.2kg, based on specified criteria pt is on appropriate dosage of Eliquis 5mg  BID.  Will refill rx.

## 2021-06-20 NOTE — Telephone Encounter (Signed)
*  STAT* If patient is at the pharmacy, call can be transferred to refill team.   1. Which medications need to be refilled? (please list name of each medication and dose if known) Eliquis  2. Which pharmacy/location (including street and city if local pharmacy) is medication to be sent to? [facility]  Do they need a 30 day or 90 day supply?  Eliquis30 days and refills

## 2021-07-25 ENCOUNTER — Telehealth: Payer: Self-pay | Admitting: Cardiovascular Disease

## 2021-07-25 NOTE — Telephone Encounter (Signed)
Returned a call to pt and stated that we already determined no pa is required for his hta repatha and they voiced understanding

## 2021-07-25 NOTE — Telephone Encounter (Signed)
Patient's wife is following up, requesting to speak with [facility], CMA. She states there is something she forgot to mention and she will discuss further when she speaks with her. Please call.

## 2021-07-25 NOTE — Telephone Encounter (Addendum)
Pts wife called to remind [place] that his PA expires on 08/09/21 for his Repatha and he is having labs 08/06/21.. this was noted on our end 05/2021.. will froward to the Pharmacist for review.

## 2021-07-25 NOTE — Telephone Encounter (Signed)
Patient and wife called to talk with Dr. Burt Knack or nurse in regards to patients Evolocumab (REPATHA SURECLICK) 574 MG/ML SOAJ

## 2021-07-25 NOTE — Telephone Encounter (Signed)
Returned call to pt's wife and they stated that they called the htx insurance back and they told her I was wrong. I advised her I manage all these pas for all of heartcare and that I know what I am doing and to not get so worked up and we will cross that bridge when we get there and they voiced understanding

## 2021-07-26 NOTE — Telephone Encounter (Signed)
Called the pharmacy to see if they were able to fill the repatha and they stated that the insurance would pay for the repatha again on the 19th of September.

## 2021-08-06 ENCOUNTER — Other Ambulatory Visit: Payer: Self-pay

## 2021-08-06 ENCOUNTER — Other Ambulatory Visit: Payer: PPO | Admitting: *Deleted

## 2021-08-06 DIAGNOSIS — E785 Hyperlipidemia, unspecified: Secondary | ICD-10-CM | POA: Diagnosis not present

## 2021-08-06 LAB — LIPID PANEL
Chol/HDL Ratio: 2.9 ratio (ref 0.0–5.0)
Cholesterol, Total: 126 mg/dL (ref 100–199)
HDL: 44 mg/dL (ref >39)
LDL Chol Calc (NIH): 62 mg/dL (ref 0–99)
Triglycerides: 107 mg/dL (ref 0–149)
VLDL Cholesterol Cal: 20 mg/dL (ref 5–40)

## 2021-08-07 ENCOUNTER — Telehealth: Payer: Self-pay | Admitting: Pharmacist

## 2021-08-07 NOTE — Telephone Encounter (Signed)
Spoke to patient's wife. Made her aware of labs. LDL improved on Repatha. Continue Repatha 140mg  q 14 days. Wife requested I submit PA. PA submitted on covermymeds. Came back no authorization needed.

## 2021-08-23 ENCOUNTER — Telehealth: Payer: Self-pay | Admitting: Cardiovascular Disease

## 2021-08-23 NOTE — Telephone Encounter (Signed)
Spoke with wife. Submitted PA- PA approved. LVM per DPR that PA was approved.

## 2021-08-23 NOTE — Telephone Encounter (Signed)
Pts wife is reaching out with questions about Repatha medication.. please advise

## 2021-09-10 ENCOUNTER — Other Ambulatory Visit: Payer: Self-pay | Admitting: Family Medicine

## 2021-09-10 ENCOUNTER — Ambulatory Visit
Admission: RE | Admit: 2021-09-10 | Discharge: 2021-09-10 | Disposition: A | Discharge status: Home / Self Care | Payer: PPO | Source: Ambulatory Visit | Attending: Family Medicine | Admitting: Family Medicine

## 2021-09-10 DIAGNOSIS — R062 Wheezing: Secondary | ICD-10-CM

## 2021-09-10 DIAGNOSIS — J449 Chronic obstructive pulmonary disease, unspecified: Secondary | ICD-10-CM | POA: Diagnosis not present

## 2021-09-10 DIAGNOSIS — R059 Cough, unspecified: Secondary | ICD-10-CM | POA: Diagnosis not present

## 2021-09-18 ENCOUNTER — Telehealth: Payer: Self-pay | Admitting: Internal Medicine

## 2021-09-18 NOTE — Telephone Encounter (Signed)
disregard

## 2021-09-25 ENCOUNTER — Telehealth: Payer: Self-pay | Admitting: Cardiovascular Disease

## 2021-09-25 NOTE — Telephone Encounter (Signed)
Okay to receive covid booster from cardiology standpoint.  Verbalized understanding.

## 2021-09-25 NOTE — Telephone Encounter (Signed)
Follow Up:     Patient's wife is returning Ivy's call from today.

## 2021-09-25 NOTE — Telephone Encounter (Signed)
Left the pts wife Diane (on [place]) a message to call the office back.

## 2021-09-25 NOTE — Telephone Encounter (Signed)
New message:     Please call, patient says he have a few questions about the Covid Booster?

## 2021-10-01 ENCOUNTER — Encounter: Payer: Self-pay | Admitting: Internal Medicine

## 2021-10-01 ENCOUNTER — Other Ambulatory Visit: Payer: Self-pay

## 2021-10-01 ENCOUNTER — Ambulatory Visit: Payer: PPO | Admitting: Internal Medicine

## 2021-10-01 VITALS — BP 134/78 | HR 67 | Temp 98.1°F | Ht 68.0 in | Wt 183.4 lb

## 2021-10-01 DIAGNOSIS — Z85118 Personal history of other malignant neoplasm of bronchus and lung: Secondary | ICD-10-CM | POA: Diagnosis not present

## 2021-10-01 DIAGNOSIS — J449 Chronic obstructive pulmonary disease, unspecified: Secondary | ICD-10-CM

## 2021-10-01 DIAGNOSIS — Z87891 Personal history of nicotine dependence: Secondary | ICD-10-CM | POA: Diagnosis not present

## 2021-10-01 MED ORDER — ANORO ELLIPTA 62.5-25 MCG/ACT IN AEPB: 1.0000 | INHALATION_SPRAY | Freq: Every day | RESPIRATORY_TRACT | 3 refills | Status: DC

## 2021-10-01 NOTE — Progress Notes (Signed)
The patient has been prescribed the inhaler anoro ellipta. Inhaler technique was demonstrated to patient. The patient subsequently demonstrated correct technique.

## 2021-10-01 NOTE — Progress Notes (Addendum)
Antonio Lawrence    [id number]    1946-06-25  Primary Care Physician:Wilson, Jama Flavors, MD  Referring Physician: Joya Gaskins, [address],  [place] [address] Reason for Consultation: shortness of breath Date of Consultation: 10/01/2021  Chief complaint:   Chief Complaint  Patient presents with   Consult    Pt states he has had complaints of SOB that is worse with activities and also has a productive cough with yellow phlegm that is worse in the morning as well as having some wheezing.     HPI: Antonio Lawrence is a 75 y.o. with history of lung cancer s/p lobectomy in 2001. Has atrial fibrillation and is on eliquis.   Now with progressive dyspnea with exertion, relieved with rest. He has a cough with yellow mucus production. He also has sinus congestion and drainage. No hemoptysis.   He does get wheezing sometimes. Was given steroid shot, prednisone and amoxicillin which helped the sometimes. He was govem an albuterol inhaler but hasn't used it.   He did have asthma as a child.   He does notice dyspnea when he is working his job at a [employer].   Can do his daily activities but just does them slowly.    Social history:  Occupation: works in a [employer]. Still works 5 days a week.  Exposures: lives at home with wife, with 3 dogs and cat. He is allergic to the cat.  Smoking history: 25 pack years, quit 2001.   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, PCI of the left circumflex/obtuse marginal, moderate nonobstructive disease of the LAD   Hyperglycemia    Hyperlipidemia    Hypertension    Lung cancer ([place])    Myocardial infarction [facility])    Inferoposterior STEMI October 2015, PCI of the first OM branch with a drug-eluting stent    Past Surgical History:  Procedure Laterality Date   CARDIOVERSION N/A 01/09/2018   Procedure: CARDIOVERSION;  Surgeon: Lelon Perla, MD;  Location: [place];  Service: Cardiovascular;  Laterality: N/A;   COLONOSCOPY WITH PROPOFOL N/A 01/13/2015   Procedure: COLONOSCOPY WITH PROPOFOL;  Surgeon: Beryle Beams, MD;  Location: [facility];  Service: Endoscopy;  Laterality: N/A;   ENTEROSCOPY N/A 01/13/2015   Procedure: ENTEROSCOPY with propofol;  Surgeon: Beryle Beams, MD;  Location: [facility];  Service: Endoscopy;  Laterality: N/A;   LEFT HEART CATH Bilateral 09/03/2014   Procedure: LEFT HEART CATH;  Surgeon: Laverda Page, MD;  Location: [facility] C[facility] LAB;  Service: Cardiovascular;  Laterality: Bilateral;   LOBECTOMY       Physical Exam: Blood pressure 134/78, pulse 67, temperature 98.1 F (  36.7 C), temperature source Oral, height 5\' 8"  (1.727 m), weight 183 lb 6.4 oz (83.2 kg), SpO2 98 %. Gen:      No acute distress ENT:  no nasal polyps, mucus membranes moist Lungs:    No increased respiratory effort, symmetric chest wall excursion, clear to auscultation bilaterally, no wheezes or crackles, diminished right lung base.  CV:         irregularly irregular Abd:      + bowel sounds; soft, non-tender; no distension MSK: no acute synovitis of DIP or PIP joints, no mechanics hands.  Skin:      Warm and dry; no rashes Neuro: normal speech, no focal facial asymmetry Psych: alert and oriented x3, normal mood and affect   Data Reviewed/Medical Decision Making:  Independent interpretation  of tests: Imaging:  Review of patient's chest xray images Nov 2022 revealed s/p right lobectomy, no acute process. The patient's images have been independently reviewed by me.    PFTs:  No flowsheet data found.  Labs:  Lab Results  Component Value Date   WBC 6.1 10/11/2019   HGB 13.8 10/11/2019   HCT 39.9 10/11/2019   MCV 91 10/11/2019   PLT 270 10/11/2019   Lab Results  Component Value Date   NA 140 05/28/2021   K 4.2 05/28/2021   CL 101 05/28/2021   CO2 25 05/28/2021     Immunization status:  Immunization History  Administered Date(s) Administered   Influenza, High Dose Seasonal PF 09/28/2018, 10/04/2019, 10/11/2020   Influenza,inj,Quad PF,6+ Mos 09/04/2014   Influenza-Unspecified 09/04/2014, 10/24/2017   Moderna Sars-Covid-2 Vaccination 12/13/2019, 01/10/2020, 09/23/2020, 03/31/2021, 09/29/2021   Pneumococcal Conjugate-13 08/14/2020   Pneumococcal Polysaccharide-23 08/13/2019     I reviewed prior external note(s) from PCP  I reviewed the result(s) of the labs and imaging as noted above.   I have ordered PFT   Assessment:  Shortness of breath concerning for new diagnosis COPD.  History of tobacco use disorder History of lung cancer s/p right lobectomy Atrial Fibrillation on Eliquis  Plan/Recommendations:  Will obtain a full set of PFTs.  Will start LAMA-LABA trial Start prn albuterol   We discussed disease management and progression at length today for COPD.    Return to Care: Return in about 6 weeks (around 11/12/2021).  Lenice Llamas, MD Pulmonary and [place]  CC: Patrecia Pace [place], [place]

## 2021-10-01 NOTE — Patient Instructions (Signed)
Please schedule follow up scheduled with myself in 6 weeks.  If my schedule is not open yet, we will contact you with a reminder closer to that time.  Before your next visit I would like you to have: PFT - 1 hour, appointment with me after.   Start anoro inhaler 1 puff once a day.   Take the albuterol rescue inhaler every 4 to 6 hours as needed for wheezing or shortness of breath. You can also take it 15 minutes before exercise or exertional activity. Side effects include heart racing or pounding, jitters or anxiety. If you have a history of an irregular heart rhythm, it can make this worse. Can also give some patients a hard time sleeping.  Understanding COPD   What is COPD? COPD stands for chronic obstructive pulmonary (lung) disease. COPD is a general term used for several lung diseases.  COPD is an umbrella term and encompasses other  common diseases in this group like chronic bronchitis and emphysema. Chronic asthma may also be included in this group. While some patients with COPD have only chronic bronchitis or emphysema, most patients have a combination of both.  You might hear these terms used in exchange for one another.   COPD adds to the work of the heart. Diseased lungs may reduce the amount of oxygen that goes to the blood. High blood pressure in blood vessels from the heart to the lungs makes it difficult for the heart to pump. Lung disease can also cause the body to produce too many red blood cells which may make the blood thicker and harder to pump.   Patients who have COPD with low oxygen levels may develop an enlarged heart (cor pulmonale). This condition weakens the heart and causes increased shortness of breath and swelling in the legs and feet.   Chronic bronchitis Chronic bronchitis is irritation and inflammation (swelling) of the lining in the bronchial tubes (air passages). The irritation causes coughing and an excess amount of mucus in the airways. The swelling makes it  difficult to get air in and out of the lungs. The small, hair-like structures on the inside of the airways (called cilia) may be damaged by the irritation. The cilia are then unable to help clean mucus from the airways.  Bronchitis is generally considered to be chronic when you have: a productive cough (cough up mucus) and shortness of breath that lasts about 3 months or more each year for 2 or more years in a row. Your doctor may define chronic bronchitis differently.   Emphysema Emphysema is the destruction, or breakdown, of the walls of the alveoli (air sacs) located at the end of the bronchial tubes. The damaged alveoli are not able to exchange oxygen and carbon dioxide between the lungs and the blood. The bronchioles lose their elasticity and collapse when you exhale, trapping air in the lungs. The trapped air keeps fresh air and oxygen from entering the lungs.   Who is affected by COPD? Emphysema and chronic bronchitis affect approximately 16 million people in the [place], or close to 11 percent of the population.   Symptoms of COPD  Shortness of breath  Shortness of breath with mild exercise (walking, using the stairs, etc.)  Chronic, productive cough (with mucus)  A feeling of "tightness" in the chest  Wheezing   What causes COPD? The two primary causes of COPD are cigarette smoking and alpha1-antitrypsin (AAT) deficiency. Air pollution and occupational dusts may also contribute to COPD, especially when the  person exposed to these substances is a cigarette smoker.  Cigarette smoke causes COPD by irritating the airways and creating inflammation that narrows the airways, making it more difficult to breathe. Cigarette smoke also causes the cilia to stop working properly so mucus and trapped particles are not cleaned from the airways. As a result, chronic cough and excess mucus production develop, leading to chronic bronchitis.  In some people, chronic bronchitis and infections can lead  to destruction of the small airways, or emphysema.  AAT deficiency, an inherited disorder, can also lead to emphysema. Alpha antitrypsin (AAT) is a protective material produced in the liver and transported to the lungs to help combat inflammation. When there is not enough of the chemical AAT, the body is no longer protected from an enzyme in the white blood cells.   How is COPD diagnosed?  To diagnose COPD, the physician needs to know: Do you smoke?  Have you had chronic exposure to dust or air pollutants?  Do other members of your family have lung disease?  Are you short of breath?  Do you get short of breath with exercise?  Do you have chronic cough and/or wheezing?  Do you cough up excess mucus?  To help with the diagnosis, the physician will conduct a thorough physical exam which includes:  Listening to your lungs and heart  Checking your blood pressure and pulse  Examining your nose and throat  Checking your feet and ankles for swelling   Laboratory and other tests Several laboratory and other tests are needed to confirm a diagnosis of COPD. These tests may include:  Chest X-ray to look for lung changes that could be caused by COPD   Spirometry and pulmonary function tests (PFTs) to determine lung volume and air flow  Pulse oximetry to measure the saturation of oxygen in the blood  Arterial blood gases (ABGs) to determine the amount of oxygen and carbon dioxide in the blood  Exercise testing to determine if the oxygen level in the blood drops during exercise   Treatment In the beginning stages of COPD, there is minimal shortness of breath that may be noticed only during exercise. As the disease progresses, shortness of breath may worsen and you may need to wear an oxygen device.   To help control other symptoms of COPD, the following treatments and lifestyle changes may be prescribed.  Quitting smoking  Avoiding cigarette smoke and other irritants  Taking medications  including: a. bronchodilators b. anti-inflammatory agents c. oxygen d. antibiotics  Maintaining a healthy diet  Following a structured exercise program such as pulmonary rehabilitation Preventing respiratory infections  Controlling stress   If your COPD progresses, you may be eligible to be evaluated for lung volume reduction surgery or lung transplantation. You may also be eligible to participate in certain clinical trials (research studies). Ask your health care providers about studies being conducted in your hospital.   What is the outlook? Although COPD can not be cured, its symptoms can be treated and your quality of life can be improved. Your prognosis or outlook for the future will depend on how well your lungs are functioning, your symptoms, and how well you respond to and follow your treatment plan.

## 2021-10-23 ENCOUNTER — Other Ambulatory Visit: Payer: Self-pay | Admitting: Physician Assistant

## 2021-10-23 DIAGNOSIS — E782 Mixed hyperlipidemia: Secondary | ICD-10-CM

## 2021-12-17 ENCOUNTER — Ambulatory Visit: Payer: PPO | Admitting: Internal Medicine

## 2021-12-17 ENCOUNTER — Encounter: Payer: Self-pay | Admitting: Internal Medicine

## 2021-12-17 ENCOUNTER — Ambulatory Visit (INDEPENDENT_AMBULATORY_CARE_PROVIDER_SITE_OTHER): Payer: PPO | Admitting: Internal Medicine

## 2021-12-17 ENCOUNTER — Other Ambulatory Visit: Payer: Self-pay | Admitting: Internal Medicine

## 2021-12-17 ENCOUNTER — Other Ambulatory Visit: Payer: Self-pay

## 2021-12-17 VITALS — BP 112/68 | HR 62 | Temp 98.4°F | Ht 67.0 in | Wt 181.6 lb

## 2021-12-17 DIAGNOSIS — J449 Chronic obstructive pulmonary disease, unspecified: Secondary | ICD-10-CM

## 2021-12-17 LAB — PULMONARY FUNCTION TEST
DL/VA % pred: 70 %
DL/VA: 2.83 ml/min/mmHg/L
DLCO cor % pred: 62 %
DLCO cor: 14.28 ml/min/mmHg
DLCO unc % pred: 62 %
DLCO unc: 14.28 ml/min/mmHg
FEF 25-75 Post: 1.17 L/sec
FEF 25-75 Pre: 0.99 L/sec
FEF2575-%Change-Post: 17 %
FEF2575-%Pred-Post: 59 %
FEF2575-%Pred-Pre: 50 %
FEV1-%Change-Post: 7 %
FEV1-%Pred-Post: 74 %
FEV1-%Pred-Pre: 68 %
FEV1-Post: 2 L
FEV1-Pre: 1.86 L
FEV1FVC-%Change-Post: 4 %
FEV1FVC-%Pred-Pre: 79 %
FEV6-%Change-Post: 3 %
FEV6-%Pred-Post: 93 %
FEV6-%Pred-Pre: 90 %
FEV6-Post: 3.27 L
FEV6-Pre: 3.17 L
FEV6FVC-%Change-Post: 0 %
FEV6FVC-%Pred-Post: 106 %
FEV6FVC-%Pred-Pre: 106 %
FVC-%Change-Post: 3 %
FVC-%Pred-Post: 89 %
FVC-%Pred-Pre: 86 %
FVC-Post: 3.33 L
FVC-Pre: 3.23 L
Post FEV1/FVC ratio: 60 %
Post FEV6/FVC ratio: 99 %
Pre FEV1/FVC ratio: 58 %
Pre FEV6/FVC Ratio: 99 %
RV % pred: 91 %
RV: 2.18 L
TLC % pred: 83 %
TLC: 5.37 L

## 2021-12-17 MED ORDER — ANORO ELLIPTA 62.5-25 MCG/ACT IN AEPB: 1.0000 | INHALATION_SPRAY | Freq: Every day | RESPIRATORY_TRACT | 5 refills | Status: DC

## 2021-12-17 NOTE — Progress Notes (Signed)
Antonio Lawrence    [id number]    November 01, 1946  Primary Care Physician:Wilson, Jama Flavors, MD Date of Appointment: 12/17/2021 Established Patient Visit  Chief complaint:   Chief Complaint  Patient presents with   Follow-up    PFT review      HPI: Antonio Lawrence is a 76 y.o. man with COPD and lung  Interval Updates: Here for follow up after initiation of LAMA-LABA and after PFTs.  Overall feeling much better since starting anoro.  No hospitalizations or ED visits.   No coughing or shortness of breath.   Lives at home with wife, three dogs and a cat. Takes them outdoors.   Continues to work 5 days a week in [employer].  Works in an office.   I have reviewed the patient's family social and past medical history and updated as appropriate.   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, PCI of the left circumflex/obtuse marginal, moderate nonobstructive disease of the LAD   Hyperglycemia    Hyperlipidemia    Hypertension    Lung cancer ([place])    Myocardial infarction [facility])    Inferoposterior STEMI October 2015, PCI of the first OM branch with a drug-eluting stent    Past Surgical History:  Procedure Laterality Date   CARDIOVERSION N/A 01/09/2018   Procedure: CARDIOVERSION;  Surgeon: Lelon Perla, MD;  Location: [place];  Service: Cardiovascular;  Laterality: N/A;   COLONOSCOPY WITH PROPOFOL N/A 01/13/2015   Procedure: COLONOSCOPY WITH PROPOFOL;  Surgeon: Beryle Beams, MD;  [facility];  Service: Endoscopy;  Laterality: N/A;   ENTEROSCOPY N/A 01/13/2015   Procedure: ENTEROSCOPY with propofol;  Surgeon: Beryle Beams, MD;  Location: [facility];  Service: Endoscopy;  Laterality: N/A;   LEFT HEART CATH Bilateral 09/03/2014   Procedure: LEFT HEART CATH;  Surgeon: Laverda Page, MD;  [facility] CATH LAB;  Service: Cardiovascular;  Laterality:  Bilateral;   LOBECTOMY      Family History  Problem Relation Age of Onset   Heart attack Mother    Cancer Father     Social History   Occupational History   Not on file  Tobacco Use   Smoking status: Former    Packs/day: 1.00    Years: 25.00    Pack years: 25.00    Types: Cigarettes    Quit date: 12/26/1999    Years since quitting: 21.9   Smokeless tobacco: Never  Vaping Use   Vaping Use: Never used  Substance and Sexual Activity   Alcohol use: No   Drug use: No   Sexual activity: Yes     Physical Exam: Blood pressure 112/68, pulse 62, temperature 98.4 F (36.9 C), temperature source Oral, height 5\' 7"  (1.702 m), weight 181 lb 9.6 oz (82.4 kg), SpO2 98 %.  Gen:      No acute distress ENT:  no nasal polyps, mucus membranes moist Lungs:    No increased respiratory effort, symmetric chest wall excursion, clear to auscultation bilaterally, no wheezes or crackles CV:         Regular rate and rhythm; no murmurs, rubs, or gallops.  No pedal edema   Data Reviewed: Imaging: I have personally reviewed the   PFTs:  PFT Results Latest Ref Rng & Units 12/17/2021  FVC-Pre L 3.23  FVC-Predicted Pre % 86  FVC-Post L 3.33  FVC-Predicted Post % 89  Pre FEV1/FVC % % 58  Post FEV1/FCV % % 60  FEV1-Pre L 1.86  FEV1-Predicted Pre % 68  FEV1-Post L 2.00  DLCO uncorrected ml/min/mmHg 14.28  DLCO UNC% % 62  DLCO corrected ml/min/mmHg 14.28  DLCO COR %Predicted % 62  DLVA Predicted % 70  TLC L 5.37  TLC % Predicted % 83  RV % Predicted % 91   I have personally reviewed the patient's PFTs and moderate COPD FEV2 68% of predicted with borderline response to bronchodilator. Normal lung volumes moderately reduced diffusion capacity   Labs:  Immunization status: Immunization History  Administered Date(s) Administered   Influenza, High Dose Seasonal PF 09/28/2018, 10/04/2019, 10/11/2020   Influenza,inj,Quad PF,6+ Mos 09/04/2014   Influenza-Unspecified 09/04/2014, 10/24/2017    Moderna Covid-19 Vaccine Bivalent Booster 63yrs & up 09/29/2021   Moderna Sars-Covid-2 Vaccination 12/13/2019, 01/10/2020, 09/23/2020, 03/31/2021, 09/29/2021   Pneumococcal Conjugate-13 08/14/2020   Pneumococcal Polysaccharide-23 08/13/2019   Zoster Recombinat (Shingrix) 10/29/2021    External Records Personally Reviewed:   Assessment:  COPD, Gold stage 1 ( FEV68% of predicted), improved History of tobacco use disorder, in remission History of lung cancer s/p right lobectomy 2001, in remission Atrial fibrillation on eliquis  Plan/Recommendations:  Continue anoro Continue prn albuterol.  He is out of the window for lung cancer screening for tobacco use disorder   Return to Care: Return in about 6 months (around 06/16/2022).   Lenice Llamas, MD Pulmonary and [place]

## 2021-12-17 NOTE — Progress Notes (Signed)
PFT done today. 

## 2021-12-17 NOTE — Patient Instructions (Signed)
Please schedule follow up scheduled with myself in 6 months.  If my schedule is not open yet, we will contact you with a reminder closer to that time. Please call [phone] if you haven't heard from [place] a month before.

## 2021-12-20 ENCOUNTER — Telehealth: Payer: Self-pay | Admitting: Cardiovascular Disease

## 2021-12-20 MED ORDER — APIXABAN 5 MG PO TABS: 5.0000 mg | ORAL_TABLET | Freq: Two times a day (BID) | ORAL | 5 refills | Status: DC

## 2021-12-20 NOTE — Telephone Encounter (Signed)
Prescription refill request for Eliquis received. Indication: Afib  Last office visit: 03/19/21 Burt Knack)  Scr: 1.10 (05/28/21)  Age: 76 Weight: 82.4kg  Appropriate dose and refill sent to requested pharmacy.

## 2021-12-20 NOTE — Telephone Encounter (Signed)
°*  STAT* If patient is at the pharmacy, call can be transferred to refill team.   1. Which medications need to be refilled? (please list name of each medication and dose if known)  apixaban (ELIQUIS) 5 MG TABS tablet  2. Which pharmacy/location (including street and city if local pharmacy) is medication to be sent to? [facility] [place], [place] - [address]  3. Do they need a 30 day or 90 day supply?  30 day supply   Patient only has 4 tablets left.

## 2021-12-31 ENCOUNTER — Telehealth: Payer: Self-pay | Admitting: Internal Medicine

## 2021-12-31 MED ORDER — ALBUTEROL SULFATE HFA 108 (90 BASE) MCG/ACT IN AERS: INHALATION_SPRAY | RESPIRATORY_TRACT | 1 refills | Status: DC

## 2021-12-31 NOTE — Telephone Encounter (Signed)
I called the wife (DPR) and she wanted the Albuterol sent to [facility] in [place]. Albuterol was sent to the pharmacy. Nothing further needed.

## 2022-01-22 ENCOUNTER — Telehealth: Payer: Self-pay | Admitting: Cardiovascular Disease

## 2022-01-22 DIAGNOSIS — E785 Hyperlipidemia, unspecified: Secondary | ICD-10-CM

## 2022-01-22 DIAGNOSIS — I10 Essential (primary) hypertension: Secondary | ICD-10-CM

## 2022-01-22 MED ORDER — CARVEDILOL 25 MG PO TABS: 12.5000 mg | ORAL_TABLET | Freq: Two times a day (BID) | ORAL | 0 refills | Status: DC

## 2022-01-22 NOTE — Telephone Encounter (Signed)
Follow Up: ? ? ? ? ?Wife is calling to see if patient refill have been called in. She says he only have 1 pill left for tomorrow  please call it in today if possible ?

## 2022-01-22 NOTE — Telephone Encounter (Signed)
Chart reviewed. ? ?Rx(s) sent to pharmacy electronically. ? ?Spoke with patients wife and made her aware the rx has been sent to the pharmacy. She voiced understanding.  ?

## 2022-01-22 NOTE — Telephone Encounter (Signed)
?*  STAT* If patient is at the pharmacy, call can be transferred to refill team. ? ? ?1. Which medications need to be refilled? (please list name of each medication and dose if known)  ?carvedilol (COREG) 25 MG tablet ? ?2. Which pharmacy/location (including street and city if local pharmacy) is medication to be sent to? ?[place], [place] [address] ? ?3. Do they need a 30 day or 90 day supply?  ?90 day supply ? ?

## 2022-01-31 ENCOUNTER — Other Ambulatory Visit: Payer: Self-pay | Admitting: Cardiovascular Disease

## 2022-03-18 ENCOUNTER — Ambulatory Visit (HOSPITAL_COMMUNITY): Payer: PPO | Attending: Cardiology

## 2022-03-18 DIAGNOSIS — I4819 Other persistent atrial fibrillation: Secondary | ICD-10-CM | POA: Insufficient documentation

## 2022-03-18 LAB — ECHOCARDIOGRAM COMPLETE: S' Lateral: 3.8 cm

## 2022-03-18 MED ORDER — PERFLUTREN LIPID MICROSPHERE
1.0000 mL | INTRAVENOUS | Status: AC | PRN
  Administered 2022-03-18: 2 mL via INTRAVENOUS

## 2022-03-25 ENCOUNTER — Encounter (INDEPENDENT_AMBULATORY_CARE_PROVIDER_SITE_OTHER): Payer: Self-pay

## 2022-03-25 ENCOUNTER — Ambulatory Visit: Payer: PPO | Admitting: Cardiovascular Disease

## 2022-03-25 ENCOUNTER — Encounter: Payer: Self-pay | Admitting: Cardiovascular Disease

## 2022-03-25 VITALS — BP 134/74 | HR 70 | Ht 68.0 in | Wt 183.2 lb

## 2022-03-25 DIAGNOSIS — I4819 Other persistent atrial fibrillation: Secondary | ICD-10-CM

## 2022-03-25 DIAGNOSIS — I10 Essential (primary) hypertension: Secondary | ICD-10-CM | POA: Diagnosis not present

## 2022-03-25 DIAGNOSIS — E785 Hyperlipidemia, unspecified: Secondary | ICD-10-CM

## 2022-03-25 DIAGNOSIS — E782 Mixed hyperlipidemia: Secondary | ICD-10-CM | POA: Diagnosis not present

## 2022-03-25 DIAGNOSIS — I2510 Atherosclerotic heart disease of native coronary artery without angina pectoris: Secondary | ICD-10-CM

## 2022-03-25 MED ORDER — APIXABAN 5 MG PO TABS: 5.0000 mg | ORAL_TABLET | Freq: Two times a day (BID) | ORAL | 3 refills | Status: DC

## 2022-03-25 MED ORDER — CARVEDILOL 25 MG PO TABS: 12.5000 mg | ORAL_TABLET | Freq: Two times a day (BID) | ORAL | 3 refills | Status: DC

## 2022-03-25 MED ORDER — EZETIMIBE 10 MG PO TABS: 10.0000 mg | ORAL_TABLET | Freq: Every day | ORAL | 3 refills | Status: DC

## 2022-03-25 MED ORDER — LISINOPRIL-HYDROCHLOROTHIAZIDE 20-12.5 MG PO TABS
2.0000 | ORAL_TABLET | Freq: Every day | ORAL | 3 refills | Status: DC
Start: 2022-03-25 — End: 2023-07-25

## 2022-03-25 NOTE — Patient Instructions (Signed)
Medication Instructions:  ?REFILL Coreg, Lisinopril/HCTZ, Eliquis, Ezetimibe ?*If you need a refill on your cardiac medications before your next appointment, please call your pharmacy* ? ? ?Lab Work: ?NONE ?If you have labs (blood work) drawn today and your tests are completely normal, you will receive your results only by: ?MyChart Message (if you have MyChart) OR ?A paper copy in the mail ?If you have any lab test that is abnormal or we need to change your treatment, we will call you to review the results. ? ? ?Testing/Procedures: ?NONE ? ? ?Follow-Up: ?At [facility], you and your health needs are our priority.  As part of our continuing mission to provide you with exceptional heart care, we have created designated Provider Care Teams.  These Care Teams include your primary Cardiologist (physician) and Advanced Practice Providers (APPs -  Physician Assistants and Nurse Practitioners) who all work together to provide you with the care you need, when you need it. ? ?Your next appointment:   ?1 year(s) ? ?The format for your next appointment:   ?In Person ? ?Provider:   ?Sherren Mocha, MD { ? ?  ?Important Information About Sugar ? ? ? ? ?  ?

## 2022-03-25 NOTE — Progress Notes (Signed)
?Cardiology Office Note:   ? ?Date:  03/25/2022  ? ?ID:  Antonio Lawrence, DOB 04-18-1946, MRN [id number] ? ?PCP:  Christain Sacramento, MD ?  ?[facility] Providers ?Cardiologist:  Sherren Mocha, MD    ? ?Referring MD: Christain Sacramento, MD  ? ?Chief Complaint  ?Patient presents with  ? Coronary Artery Disease  ? ? ?History of Present Illness:   ? ?Antonio Lawrence is a 76 y.o. 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 ?The patient is here alone today.  He is doing very well.  He is physically active with no exertional symptoms.  He specifically denies chest pain, chest pressure, shortness of breath, or heart palpitations.  He is compliant with his medications.  He denies any bleeding problems on long-term apixaban. ? ?Past Medical History:  ?Diagnosis Date  ? Coronary artery disease   ? Chronic occlusion of the RCA with left-to-right collaterals, PCI of the left circumflex/obtuse marginal, moderate nonobstructive disease of the LAD  ? Hyperglycemia   ? Hyperlipidemia   ? Hypertension   ? Lung cancer ([place])   ? Myocardial infarction [facility])   ? Inferoposterior STEMI October 2015, PCI of the first OM branch with a drug-eluting stent  ? ? ?Past Surgical History:  ?Procedure Laterality Date  ? CARDIOVERSION N/A 01/09/2018  ? Procedure: CARDIOVERSION;  Surgeon: Lelon Perla, MD;  Location: [facility] ENDOSCOPY;  Service: Cardiovascular;  Laterality: N/A;  ? COLONOSCOPY WITH PROPOFOL N/A 01/13/2015  ? Procedure: COLONOSCOPY WITH PROPOFOL;  Surgeon: Beryle Beams, MD;  Location: [facility] ENDOSCOPY;  Service: Endoscopy;  Laterality: N/A;  ? ENTEROSCOPY N/A 01/13/2015  ? Procedure: ENTEROSCOPY with propofol;  Surgeon: Beryle Beams, MD;  Location: [facility] ENDOSCOPY;  Service: Endoscopy;   Laterality: N/A;  ? LEFT HEART CATH Bilateral 09/03/2014  ? Procedure: LEFT HEART CATH;  Surgeon: Laverda Page, MD;  Location: [facility] CATH LAB;  Service: Cardiovascular;  Laterality: Bilateral;  ? LOBECTOMY    ? ? ?Current Medications: ?Current Meds  ?Medication Sig  ? albuterol (VENTOLIN HFA) 108 (90 Base) MCG/ACT inhaler SMARTSIG:2 Puff(s) By Mouth Every 6 Hours PRN  ? Multiple Vitamin (MULTI-VITAMIN DAILY PO) Take 1 tablet by mouth daily.  ? nitroGLYCERIN (NITROSTAT) 0.4 MG SL tablet Place 1 tablet (0.4 mg total) under the tongue every 5 (five) minutes as needed for chest pain.  ? REPATHA SURECLICK 144 MG/ML SOAJ INJECT 1 PEN INTO THE SKIN EVERY 14 DAYS  ? tadalafil (CIALIS) 20 MG tablet Take 20 mg by mouth daily as needed.  ? triamcinolone cream (KENALOG) 0.1 % Apply topically.  ? umeclidinium-vilanterol (ANORO ELLIPTA) 62.5-25 MCG/ACT AEPB Inhale 1 puff into the lungs daily.  ? [DISCONTINUED] apixaban (ELIQUIS) 5 MG TABS tablet Take 1 tablet (5 mg total) by mouth 2 (two) times daily.  ? [DISCONTINUED] carvedilol (COREG) 25 MG tablet Take 0.5 tablets (12.5 mg total) by mouth 2 (two) times daily with a meal.  ? [DISCONTINUED] ezetimibe (ZETIA) 10 MG tablet Take 1 tablet by mouth once daily  ? [DISCONTINUED] lisinopril-hydrochlorothiazide (ZESTORETIC) 20-12.5 MG tablet Take 2 tablets by mouth daily.  ?  ? ?Allergies:   Patient has no known allergies.  ? ?Social History  ? ?Socioeconomic  History  ? Marital status: Married  ?  Spouse name: Not on file  ? Number of children: Not on file  ? Years of education: Not on file  ? Highest education level: Not on file  ?Occupational History  ? Not on file  ?Tobacco Use  ? Smoking status: Former  ?  Packs/day: 1.00  ?  Years: 25.00  ?  Pack years: 25.00  ?  Types: Cigarettes  ?  Quit date: 12/26/1999  ?  Years since quitting: 22.2  ? Smokeless tobacco: Never  ?Vaping Use  ? Vaping Use: Never used  ?Substance and Sexual Activity  ? Alcohol use: No  ? Drug use: No  ? Sexual  activity: Yes  ?Other Topics Concern  ? Not on file  ?Social History Narrative  ? The patient is married. He is a former smoker but quit in 2001 when he was diagnosed with lung cancer.  ? ?Social Determinants of Health  ? ?Financial Resource Strain: Not on file  ?Food Insecurity: Not on file  ?Transportation Needs: Not on file  ?Physical Activity: Not on file  ?Stress: Not on file  ?Social Connections: Not on file  ?  ? ?Family History: ?The patient's family history includes Cancer in his father; Heart attack in his mother. ? ?ROS:   ?Please see the history of present illness.    ?All other systems reviewed and are negative. ? ?EKGs/Labs/Other Studies Reviewed:   ? ?The following studies were reviewed today: ?Echo 03/18/2022: ? 1. Left ventricular ejection fraction, by estimation, is 55 to 60%. The  ?left ventricle has normal function. The left ventricle has no regional  ?wall motion abnormalities. There is mild left ventricular hypertrophy.  ?Left ventricular diastolic parameters  ?are indeterminate.  ? 2. Right ventricular systolic function is normal. The right ventricular  ?size is mildly enlarged. There is normal pulmonary artery systolic  ?pressure. The estimated right ventricular systolic pressure is 23.5 mmHg.  ? 3. Left atrial size was mildly dilated.  ? 4. Right atrial size was mildly dilated.  ? 5. The mitral valve is normal in structure. Mild to moderate mitral valve  ?regurgitation. No evidence of mitral stenosis.  ? 6. The aortic valve is tricuspid. There is moderate calcification of the  ?aortic valve. Aortic valve regurgitation is trivial. Aortic valve  ?sclerosis/calcification is present, without any evidence of aortic  ?stenosis.  ? 7. Aortic dilatation noted. There is mild dilatation of the aortic root,  ?measuring 40 mm.  ? 8. The inferior vena cava is normal in size with greater than 50%  ?respiratory variability, suggesting right atrial pressure of 3 mmHg.  ? 9. The patient was in atrial  fibrillation.  ? ?EKG:  EKG is ordered today.  The ekg ordered today demonstrates atrial fibrillation 70 bpm, nonspecific ST abnormality ? ?Recent Labs: ?05/28/2021: BUN 19; Creatinine, Ser 1.10; Potassium 4.2; Sodium 140  ?Recent Lipid Panel ?   ?Component Value Date/Time  ? CHOL 126 08/06/2021 0922  ? TRIG 107 08/06/2021 0922  ? HDL 44 08/06/2021 0922  ? CHOLHDL 2.9 08/06/2021 0922  ? CHOLHDL 3.3 12/01/2015 0957  ? VLDL 23 12/01/2015 0957  ? [place] 62 08/06/2021 0922  ? ? ? ?Risk Assessment/Calculations:   ? ?CHA2DS2-VASc Score = 4  ? This indicates a 4.8% annual risk of stroke. ?The patient's score is based upon: ?CHF History: 0 ?HTN History: 1 ?Diabetes History: 0 ?Stroke History: 0 ?Vascular Disease History: 1 ?Age Score: 2 ?Gender Score: 0 ?  ? ? ?    ? ?  Physical Exam:   ? ?VS:  BP 134/74   Pulse 70   Ht 5\' 8"  (1.727 m)   Wt 183 lb 3.2 oz (83.1 kg)   SpO2 97%   BMI 27.86 kg/m?    ? ?Wt Readings from Last 3 Encounters:  ?03/25/22 183 lb 3.2 oz (83.1 kg)  ?12/17/21 181 lb 9.6 oz (82.4 kg)  ?10/01/21 183 lb 6.4 oz (83.2 kg)  ?  ? ?GEN:  Well nourished, well developed in no acute distress ?HEENT: Normal ?NECK: No JVD; No carotid bruits ?LYMPHATICS: No lymphadenopathy ?CARDIAC: irregularly irregular, no murmurs, rubs, gallops ?RESPIRATORY:  Clear to auscultation without rales, wheezing or rhonchi  ?ABDOMEN: Soft, non-tender, non-distended ?MUSCULOSKELETAL:  No edema; No deformity  ?SKIN: Warm and dry ?NEUROLOGIC:  Alert and oriented x 3 ?PSYCHIATRIC:  Normal affect  ? ?ASSESSMENT:   ? ?1. Coronary artery disease involving native coronary artery of native heart without angina pectoris   ?2. Persistent atrial fibrillation ([place])   ?3. Mixed hyperlipidemia   ?4. Essential hypertension   ?5. Hyperlipidemia   ? ?PLAN:   ? ?In order of problems listed above: ? ?Stable without symptoms of angina.  He is off of antiplatelet therapy in the context of chronic oral anticoagulation.  Patient is treated with an ACE  inhibitor and beta-blocker.  Continue current medications and plan follow-up in 1 year. ?Heart rate remains well controlled on carvedilol.  Anticoagulated with apixaban.  Recent echo reviewed that demonstrated mild biatrial

## 2022-03-27 ENCOUNTER — Telehealth: Payer: Self-pay | Admitting: Cardiovascular Disease

## 2022-03-27 NOTE — Telephone Encounter (Signed)
?*  STAT* If patient is at the pharmacy, call can be transferred to refill team. ? ? ?1. Which medications need to be refilled? (please list name of each medication and dose if known) REPATHA SURECLICK 493 MG/ML SOAJ  ? ? ? ?2. Which pharmacy/location (including street and city if local pharmacy) is medication to be sent to? [facility] [place] [address] ? ?3. Do they need a 30 day or 90 day supply?   ? ?[employer] will not approve Repatha unless this is approved through the office.   ? ?Also the patient has questions in regards to a booster shot for covid.  ?

## 2022-03-28 MED ORDER — REPATHA SURECLICK 140 MG/ML ~~LOC~~ SOAJ: SUBCUTANEOUS | 3 refills | Status: DC

## 2022-03-28 NOTE — Telephone Encounter (Signed)
Pt already has active prior authorization on file with [employer] insurance through 08/23/22 and pharmacy has active prescription on file, nothing should be needed for the pharmacy to process this. Will send in another refill for rx.  Called pt to advise him of this and discuss his COVID booster questions. Spoke with his wife who reports he still has a few Repatha pens at home so this is likely why the pharmacy is unable to fill rx for refill too soon. Also states the pharmacist at [facility] mentioned he was due for 2nd COVID vaccine booster per CDC recommendations. Advised this is perfectly fine for him to get. She was appreciative for the call.

## 2022-06-17 ENCOUNTER — Encounter: Payer: Self-pay | Admitting: Internal Medicine

## 2022-06-17 ENCOUNTER — Ambulatory Visit: Payer: PPO | Admitting: Internal Medicine

## 2022-06-17 VITALS — BP 150/60 | HR 58 | Temp 98.3°F | Ht 68.0 in | Wt 179.2 lb

## 2022-06-17 DIAGNOSIS — J449 Chronic obstructive pulmonary disease, unspecified: Secondary | ICD-10-CM | POA: Diagnosis not present

## 2022-06-17 DIAGNOSIS — Z85118 Personal history of other malignant neoplasm of bronchus and lung: Secondary | ICD-10-CM | POA: Diagnosis not present

## 2022-06-17 MED ORDER — UMECLIDINIUM-VILANTEROL 62.5-25 MCG/ACT IN AEPB: 1.0000 | INHALATION_SPRAY | Freq: Every day | RESPIRATORY_TRACT | 3 refills | Status: DC

## 2022-06-17 MED ORDER — ANORO ELLIPTA 62.5-25 MCG/ACT IN AEPB: 1.0000 | INHALATION_SPRAY | Freq: Every day | RESPIRATORY_TRACT | Status: DC

## 2022-06-17 MED ORDER — ANORO ELLIPTA 62.5-25 MCG/ACT IN AEPB
1.0000 | INHALATION_SPRAY | Freq: Every day | RESPIRATORY_TRACT | 0 refills | Status: DC
Start: 2022-06-17 — End: 2023-06-09

## 2022-06-17 NOTE — Addendum Note (Signed)
Addended by: Alvin Critchley on: 06/17/2022 09:55 AM   Modules accepted: Orders

## 2022-06-17 NOTE — Progress Notes (Signed)
Antonio Lawrence    [id number]    18-Mar-1946  Primary Care Physician:Wilson, Jama Flavors, MD Date of Appointment: 06/17/2022 Established Patient Visit  Chief complaint:   Chief Complaint  Patient presents with   Follow-up    Follow-up: 6 months     HPI: Antonio Lawrence is a 76 y.o. man with COPD and lung cancer 2001 s/p lobectomy, now in remission.   Interval Updates: Here for follow up. Still on anoro. Doing well. No hospitalizations or ED visits.  Denies dyspnea or cough that limits his daily activities.  Minimal albuterol use less than once a week. Usually uses it once before mowing the lawn.  He does endorse having some white coat hypertension. But says SBP runs 130s at home. It was in 150s today.   Lives at home with wife, three dogs and a cat. Takes them outdoors.   Continues to work 5 days a week in [employer].  Works in an office.   I have reviewed the patient's family social and past medical history and updated as appropriate.   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, PCI of the left circumflex/obtuse marginal, moderate nonobstructive disease of the LAD   Hyperglycemia    Hyperlipidemia    Hypertension    Lung cancer ([place])    Myocardial infarction [facility])    Inferoposterior STEMI October 2015, PCI of the first OM branch with a drug-eluting stent    Past Surgical History:  Procedure Laterality Date   CARDIOVERSION N/A 01/09/2018   Procedure: CARDIOVERSION;  Surgeon: Lelon Perla, MD;  Location: [facility];  Service: Cardiovascular;  Laterality: N/A;   COLONOSCOPY WITH PROPOFOL N/A 01/13/2015   Procedure: COLONOSCOPY WITH PROPOFOL;  Surgeon: Beryle Beams, MD;  Location: [facility] ENDOSCOPY;  Service: Endoscopy;  Laterality: N/A;   ENTEROSCOPY N/A 01/13/2015   Procedure: ENTEROSCOPY with propofol;  Surgeon: Beryle Beams, MD;  Location: [facility] ENDOSCOPY;   Service: Endoscopy;  Laterality: N/A;   LEFT HEART CATH Bilateral 09/03/2014   Procedure: LEFT HEART CATH;  Surgeon: Laverda Page, MD;  Location: [facility] CATH LAB;  Service: Cardiovascular;  Laterality: Bilateral;   LOBECTOMY      Family History  Problem Relation Age of Onset   Heart attack Mother    Cancer Father     Social History   Occupational History   Not on file  Tobacco Use   Smoking status: Former    Packs/day: 1.00    Years: 25.00    Total pack years: 25.00    Types: Cigarettes    Quit date: 12/26/1999    Years since quitting: 22.4   Smokeless tobacco: Never  Vaping Use   Vaping Use: Never used  Substance and Sexual Activity   Alcohol use: No   Drug use: No   Sexual activity: Yes     Physical Exam: Blood pressure (!) 150/60, pulse (!) 58, temperature 98.3 F (36.8 C), temperature source Oral, height 5\' 8"  (1.727 m), weight 179 lb 3.2 oz (81.3 kg), SpO2 97 %.  Gen:      No acute distress Lungs:    ctab no wheezes or crackles CV:         RRR no mrg   Data Reviewed: Imaging: I have personally reviewed the   PFTs:     Latest Ref Rng & Units 12/17/2021  12:49 PM  PFT Results  FVC-Pre L 3.23   FVC-Predicted Pre % 86   FVC-Post L 3.33   FVC-Predicted Post % 89   Pre FEV1/FVC % % 58   Post FEV1/FCV % % 60   FEV1-Pre L 1.86   FEV1-Predicted Pre % 68   FEV1-Post L 2.00   DLCO uncorrected ml/min/mmHg 14.28   DLCO UNC% % 62   DLCO corrected ml/min/mmHg 14.28   DLCO COR %Predicted % 62   DLVA Predicted % 70   TLC L 5.37   TLC % Predicted % 83   RV % Predicted % 91    I have personally reviewed the patient's PFTs and moderate COPD FEV2 68% of predicted with borderline response to bronchodilator. Normal lung volumes moderately reduced diffusion capacity   Labs:  Immunization status: Immunization History  Administered Date(s) Administered   Influenza, High Dose Seasonal PF 09/28/2018, 10/04/2019, 10/11/2020   Influenza,inj,Quad PF,6+ Mos  09/04/2014   Influenza-Unspecified 09/04/2014, 10/24/2017   Moderna Covid-19 Vaccine Bivalent Booster 29yrs & up 09/29/2021   Moderna Sars-Covid-2 Vaccination 12/13/2019, 01/10/2020, 09/23/2020, 03/31/2021, 09/29/2021   Pneumococcal Conjugate-13 08/14/2020   Pneumococcal Polysaccharide-23 08/13/2019   Zoster Recombinat (Shingrix) 10/29/2021, 02/19/2022    External Records Personally Reviewed:   Assessment:  COPD, Gold stage 1 ( FEV68% of predicted), stable History of tobacco use disorder, in remission History of lung cancer s/p right lobectomy 2001, in remission Atrial fibrillation on eliquis  Plan/Recommendations:  continue anoro continue prn albuterol.  He is out of the window for lung cancer screening for tobacco use disorder   Return to Care: Return in about 1 year (around 06/18/2023).   Lenice Llamas, MD Pulmonary and [place]

## 2022-06-17 NOTE — Patient Instructions (Addendum)
Please schedule follow up scheduled with myself in 1 year.  If my schedule is not open yet, we will contact you with a reminder closer to that time. Please call [phone] if you haven't heard from [place] a month before.   Continue anoro. Continue albuterol as needed.   Call me sooner if any issues or concerns with your breathing.   Make sure you get the flu shot and RSV vaccine this fall - call us if any problems obtaining these.

## 2022-07-11 ENCOUNTER — Telehealth: Payer: Self-pay | Admitting: Cardiovascular Disease

## 2022-07-11 NOTE — Telephone Encounter (Signed)
Pt wife called asking if its okay for pt to get RSV vaccincation, please advise.

## 2022-07-12 NOTE — Telephone Encounter (Signed)
Follow Up:     Wife is calling back to see what was decided.

## 2022-07-12 NOTE — Telephone Encounter (Signed)
Left message for patient advising that Dr. Burt Knack is out of the office today but a message has been sent to him and we would call back next week with his response. Advised to call back with any other questions or concerns.

## 2022-07-16 NOTE — Telephone Encounter (Signed)
He should check with PCP. I don't have any expertise in RSV vaccination. I don't know of any contraindication with his heart disease.

## 2022-07-17 NOTE — Telephone Encounter (Signed)
Left message of Dr Antionette Char comments on pt's voicemail. Advised he call office back if further questions.

## 2022-08-07 ENCOUNTER — Telehealth: Payer: Self-pay | Admitting: Cardiovascular Disease

## 2022-08-07 NOTE — Telephone Encounter (Signed)
PA request received via fax. Submitted back to insurance. PA will expire on 08/23/22. Submitted for renewal.

## 2022-08-07 NOTE — Telephone Encounter (Signed)
Pt c/o medication issue:  1. Name of Medication:   Evolocumab (REPATHA SURECLICK) 835 MG/ML SOAJ    2. How are you currently taking this medication (dosage and times per day)? INJECT 1 PEN INTO THE SKIN EVERY 14 DAYS  3. Are you having a reaction (difficulty breathing--STAT)? No  4. What is your medication issue? Calling in regards to Prior Authorization for medication

## 2022-08-07 NOTE — Telephone Encounter (Signed)
  Left message for patient to call [facility] back.  More information is needed to address the medication concern.

## 2022-08-08 NOTE — Telephone Encounter (Signed)
Left message informing that cholesterol medication had gotten insurance approval through 08/08/23 and to call if she had further questions or issues.

## 2022-08-08 NOTE — Telephone Encounter (Signed)
PA approved through 08/08/23

## 2022-08-08 NOTE — Telephone Encounter (Signed)
Wife was returning call. Please advise ?

## 2022-09-02 ENCOUNTER — Telehealth: Payer: Self-pay | Admitting: Internal Medicine

## 2022-09-02 NOTE — Telephone Encounter (Signed)
Dr Shearon Stalls   Are you ok with this patient receiving the RSV shot  Please advise

## 2022-09-02 NOTE — Telephone Encounter (Signed)
  RSV Vaccine Recommendations: The FDA approved the RSV (Respiratory Syncytial Virus) Vaccine in May 2023 for individuals >76 years old with recommendations to target patient with high risk for severe RSV infection. You would likely benefit from this vaccine based on your age and chronic medical conditions. Our office currently is awaiting the arrival for the vaccines and expect them to be available in the next couple weeks. All [facility] facilities will be administering Arexvy ([place] vaccine.)  The main contraindication is avoiding this vaccine in individuals with history of severe allergic reactions (e.g. anaphylaxis). We can discuss further with a shared decision making visit to fully address questions or concerns before you receive it if you would like. Please let us know if you would like to make an in-person or video visit.  Covid Vaccine Recommendations: Based on CDC recommendations, Everyone ages 75 years and older is recommended to receive 1 dose of updated (2023-2024 Formula) mRNA COVID-19 vaccine, regardless of their previous vaccination status.   Influenza Vaccine Recommendations: Based on CDC recommendations, everyone 6 months and older should receive an influenza vaccine annually. We will carry the quadrivalent flu shot as well as the high dose flu shot which is indicated for all people aged 15 and over.

## 2022-09-02 NOTE — Telephone Encounter (Signed)
Called and left detailed message that Dr Shearon Stalls is ok with him receiving the RSV vaccine. And if he has any questions or concerns to call the office back. Nothing further needed

## 2022-09-14 ENCOUNTER — Other Ambulatory Visit: Payer: Self-pay | Admitting: Cardiovascular Disease

## 2022-12-06 ENCOUNTER — Other Ambulatory Visit: Payer: Self-pay | Admitting: Internal Medicine

## 2022-12-19 ENCOUNTER — Other Ambulatory Visit: Payer: Self-pay | Admitting: Cardiovascular Disease

## 2022-12-19 NOTE — Telephone Encounter (Signed)
Routed to Pharm[employer] Pool 

## 2023-01-16 ENCOUNTER — Other Ambulatory Visit: Payer: Self-pay | Admitting: Internal Medicine

## 2023-01-25 ENCOUNTER — Other Ambulatory Visit: Payer: Self-pay | Admitting: Internal Medicine

## 2023-03-13 ENCOUNTER — Other Ambulatory Visit: Payer: Self-pay | Admitting: Cardiovascular Disease

## 2023-03-13 DIAGNOSIS — E782 Mixed hyperlipidemia: Secondary | ICD-10-CM

## 2023-03-17 ENCOUNTER — Ambulatory Visit: Payer: PPO | Attending: Cardiovascular Disease | Admitting: Cardiovascular Disease

## 2023-03-17 ENCOUNTER — Encounter: Payer: Self-pay | Admitting: Cardiovascular Disease

## 2023-03-17 VITALS — BP 140/90 | HR 56 | Ht 68.0 in | Wt 176.6 lb

## 2023-03-17 DIAGNOSIS — I2510 Atherosclerotic heart disease of native coronary artery without angina pectoris: Secondary | ICD-10-CM

## 2023-03-17 DIAGNOSIS — I4819 Other persistent atrial fibrillation: Secondary | ICD-10-CM | POA: Diagnosis not present

## 2023-03-17 DIAGNOSIS — I10 Essential (primary) hypertension: Secondary | ICD-10-CM

## 2023-03-17 DIAGNOSIS — E782 Mixed hyperlipidemia: Secondary | ICD-10-CM | POA: Diagnosis not present

## 2023-03-17 MED ORDER — EZETIMIBE 10 MG PO TABS
10.0000 mg | ORAL_TABLET | Freq: Every day | ORAL | 3 refills | Status: DC
Start: 2023-03-17 — End: 2023-11-27

## 2023-03-17 NOTE — Progress Notes (Signed)
Cardiology Office Note:    Date:  03/17/2023   ID:  Antonio Lawrence, DOB 1946-11-08, MRN [id number]  PCP:  Barbie Banner, MD   [facility] Providers Cardiologist:  Tonny Bollman, MD     Referring MD: Barbie Banner, MD   Chief Complaint  Patient presents with   Coronary Artery Disease    History of Present Illness:    Antonio Lawrence is a 77 y.o. 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 Reports that he's doing well. Today, he denies symptoms of palpitations, chest pain, shortness of breath, orthopnea, PND, lower extremity edema, dizziness, or syncope. He had labs drawn 2 weeks ago and we reviewed these through [facility].   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, PCI of the left circumflex/obtuse marginal, moderate nonobstructive disease of the LAD   Hyperglycemia    Hyperlipidemia    Hypertension    Lung cancer (HCC)    Myocardial infarction [facility])    Inferoposterior STEMI October 2015, PCI of the first OM branch with a drug-eluting stent    Past Surgical History:  Procedure Laterality Date   CARDIOVERSION N/A 01/09/2018   Procedure: CARDIOVERSION;  Surgeon: Lewayne Bunting, MD;  Location: [facility] ENDOSCOPY;  Service: Cardiovascular;  Laterality: N/A;   COLONOSCOPY WITH PROPOFOL N/A 01/13/2015   Procedure: COLONOSCOPY WITH PROPOFOL;  Surgeon: Theda Belfast, MD;  Location: [facility] ENDOSCOPY;  Service: Endoscopy;  Laterality: N/A;   ENTEROSCOPY N/A 01/13/2015   Procedure: ENTEROSCOPY with propofol;  Surgeon: Theda Belfast, MD;  Location: [facility] ENDOSCOPY;  Service: Endoscopy;  Laterality: N/A;   LEFT  HEART CATH Bilateral 09/03/2014   Procedure: LEFT HEART CATH;  Surgeon: Pamella Pert, MD;  Location: [facility] CATH LAB;  Service: Cardiovascular;  Laterality: Bilateral;   LOBECTOMY      Current Medications: Current Meds  Medication Sig   albuterol (VENTOLIN HFA) 108 (90 Base) MCG/ACT inhaler INHALE 2 PUFFS BY MOUTH EVERY 6 HOURS AS NEEDED   ANORO ELLIPTA 62.5-25 MCG/ACT AEPB INHALE 1 PUFF INTO LUNGS ONCE DAILY   apixaban (ELIQUIS) 5 MG TABS tablet Take 1 tablet (5 mg total) by mouth 2 (two) times daily.   carvedilol (COREG) 25 MG tablet Take 0.5 tablets (12.5 mg total) by mouth 2 (two) times daily with a meal.   Evolocumab (REPATHA SURECLICK) 140 MG/ML SOAJ INJECT 1 PEN (140MG  TOTAL) SUBCUTANEOUSLY EVERY 14 DAYS   lisinopril-hydrochlorothiazide (ZESTORETIC) 20-12.5 MG tablet Take 2 tablets by mouth daily.   Multiple Vitamin (MULTI-VITAMIN DAILY PO) Take 1 tablet by mouth daily.   nitroGLYCERIN (NITROSTAT) 0.4 MG SL tablet DISSOLVE ONE TABLET UNDER THE TONGUE EVERY 5 MINUTES AS NEEDED FOR CHEST PAIN.   tadalafil (CIALIS) 20 MG tablet Take 20 mg by mouth daily as needed.   triamcinolone cream (KENALOG) 0.1 % Apply topically.   umeclidinium-vilanterol (ANORO ELLIPTA) 62.5-25 MCG/ACT AEPB Inhale 1 puff into the lungs daily.   umeclidinium-vilanterol (ANORO ELLIPTA) 62.5-25 MCG/ACT AEPB Inhale 1 puff into the lungs daily.   umeclidinium-vilanterol (ANORO ELLIPTA) 62.5-25 MCG/ACT AEPB INHALE 1 PUFF INTO LUNGS ONCE DAILY   [  DISCONTINUED] ezetimibe (ZETIA) 10 MG tablet Take 1 tablet by mouth once daily     Allergies:   Patient has no known allergies.   Social History   Socioeconomic History   Marital status: Married    Spouse name: Not on file   Number of children: Not on file   Years of education: Not on file   Highest education level: Not on file  Occupational History   Not on file  Tobacco Use   Smoking status: Former    Packs/day: 1.00    Years: 25.00    Additional pack years:  0.00    Total pack years: 25.00    Types: Cigarettes    Quit date: 12/26/1999    Years since quitting: 23.2   Smokeless tobacco: Never  Vaping Use   Vaping Use: Never used  Substance and Sexual Activity   Alcohol use: No   Drug use: No   Sexual activity: Yes  Other Topics Concern   Not on file  Social History Narrative   The patient is married. He is a former smoker but quit in 2001 when he was diagnosed with lung cancer.   Social Determinants of Health   Financial Resource Strain: Not on file  Food Insecurity: Not on file  Transportation Needs: Not on file  Physical Activity: Not on file  Stress: Not on file  Social Connections: Not on file     Family History: The patient's family history includes Cancer in his father; Heart attack in his mother.  ROS:   Please see the history of present illness.    All other systems reviewed and are negative.  EKGs/Labs/Other Studies Reviewed:    The following studies were reviewed today: Cardiac Studies & Procedures       ECHOCARDIOGRAM  ECHOCARDIOGRAM COMPLETE 03/18/2022  Narrative ECHOCARDIOGRAM REPORT    Patient Name:   Antonio Lawrence Date of Exam: 03/18/2022 Medical Rec #:  [id number]         Height:       67.0 in Accession #:    [id number]        Weight:       181.6 lb Date of Birth:  October 21, 1946         BSA:          1.941 m Patient Age:    76 years          BP:           112/68 mmHg Patient Gender: M                 HR:           64 bpm. Exam Location:  [address]  Procedure: 2D Echo, Cardiac Doppler, Color Doppler and Intracardiac Opacification Agent  Indications:    I48.91 Atrial Fibrillation  History:        Patient has prior history of Echocardiogram examinations, most recent 12/01/2017. CAD and Previous Myocardial Infarction; Risk Factors:Hypertension and Dyslipidemia. History of lung cancer.  Sonographer:    Sedonia Small Rodgers-Jones RDCS Referring Phys: [id number] Julicia Krieger  IMPRESSIONS   1. Left  ventricular ejection fraction, by estimation, is 55 to 60%. The left ventricle has normal function. The left ventricle has no regional wall motion abnormalities. There is mild left ventricular hypertrophy. Left ventricular diastolic parameters are indeterminate. 2. Right ventricular systolic function is normal. The right ventricular size is mildly enlarged. There is normal pulmonary artery systolic pressure. The estimated right ventricular systolic pressure is 25.8  mmHg. 3. Left atrial size was mildly dilated. 4. Right atrial size was mildly dilated. 5. The mitral valve is normal in structure. Mild to moderate mitral valve regurgitation. No evidence of mitral stenosis. 6. The aortic valve is tricuspid. There is moderate calcification of the aortic valve. Aortic valve regurgitation is trivial. Aortic valve sclerosis/calcification is present, without any evidence of aortic stenosis. 7. Aortic dilatation noted. There is mild dilatation of the aortic root, measuring 40 mm. 8. The inferior vena cava is normal in size with greater than 50% respiratory variability, suggesting right atrial pressure of 3 mmHg. 9. The patient was in atrial fibrillation.  FINDINGS Left Ventricle: Left ventricular ejection fraction, by estimation, is 55 to 60%. The left ventricle has normal function. The left ventricle has no regional wall motion abnormalities. Definity contrast agent was given IV to delineate the left ventricular endocardial borders. The left ventricular internal cavity size was normal in size. There is mild left ventricular hypertrophy. Left ventricular diastolic parameters are indeterminate.  Right Ventricle: The right ventricular size is mildly enlarged. No increase in right ventricular wall thickness. Right ventricular systolic function is normal. There is normal pulmonary artery systolic pressure. The tricuspid regurgitant velocity is 2.39 m/s, and with an assumed right atrial pressure of 3 mmHg, the  estimated right ventricular systolic pressure is 25.8 mmHg.  Left Atrium: Left atrial size was mildly dilated.  Right Atrium: Right atrial size was mildly dilated.  Pericardium: There is no evidence of pericardial effusion.  Mitral Valve: The mitral valve is normal in structure. Mild to moderate mitral valve regurgitation. No evidence of mitral valve stenosis.  Tricuspid Valve: The tricuspid valve is normal in structure. Tricuspid valve regurgitation is trivial.  Aortic Valve: The aortic valve is tricuspid. There is moderate calcification of the aortic valve. Aortic valve regurgitation is trivial. Aortic valve sclerosis/calcification is present, without any evidence of aortic stenosis.  Pulmonic Valve: The pulmonic valve was normal in structure. Pulmonic valve regurgitation is not visualized.  Aorta: Aortic dilatation noted. There is mild dilatation of the aortic root, measuring 40 mm.  Venous: The inferior vena cava is normal in size with greater than 50% respiratory variability, suggesting right atrial pressure of 3 mmHg.  IAS/Shunts: No atrial level shunt detected by color flow Doppler.   LEFT VENTRICLE PLAX 2D LVIDd:         5.10 cm LVIDs:         3.80 cm LV PW:         0.80 cm LV IVS:        0.60 cm LVOT diam:     1.70 cm LV SV:         41 LV SV Index:   21 LVOT Area:     2.27 cm   RIGHT VENTRICLE RV Basal diam:  4.40 cm RV S prime:     11.25 cm/s TAPSE (M-mode): 2.1 cm  LEFT ATRIUM             Index        RIGHT ATRIUM           Index LA diam:        4.90 cm 2.52 cm/m   RA Area:     18.20 cm LA Vol (A2C):   75.8 ml 39.05 ml/m  RA Volume:   50.40 ml  25.97 ml/m LA Vol (A4C):   56.6 ml 29.16 ml/m LA Biplane Vol: 66.9 ml 34.47 ml/m AORTIC VALVE LVOT Vmax:   80.70 cm/s  LVOT Vmean:  53.600 cm/s LVOT VTI:    0.182 m  AORTA Ao Root diam: 4.00 cm  MV E velocity: 106.00 cm/s  TRICUSPID VALVE TR Peak grad:   22.8 mmHg TR Vmax:        239.00  cm/s  SHUNTS Systemic VTI:  0.18 m Systemic Diam: 1.70 cm  Dalton McleanMD Electronically signed by Wilfred Lacy Signature Date/Time: 03/18/2022/2:25:44 PM    Final              EKG:  EKG is ordered today.  The ekg ordered today demonstrates atrial fibrillation with slow ventricular response 56 bpm, possible old inferior-posterior infarct, no significant change from prior  Recent Labs: No results found for requested labs within last 365 days.  Recent Lipid Panel    Component Value Date/Time   CHOL 126 08/06/2021 0922   TRIG 107 08/06/2021 0922   HDL 44 08/06/2021 0922   CHOLHDL 2.9 08/06/2021 0922   CHOLHDL 3.3 12/01/2015 0957   VLDL 23 12/01/2015 0957   LDLCALC 62 08/06/2021 0922     Risk Assessment/Calculations:    CHA2DS2-VASc Score = 4   This indicates a 4.8% annual risk of stroke. The patient's score is based upon: CHF History: 0 HTN History: 1 Diabetes History: 0 Stroke History: 0 Vascular Disease History: 1 Age Score: 2 Gender Score: 0          Physical Exam:    VS:  BP (!) 140/90   Pulse (!) 56   Ht 5\' 8"  (1.727 m)   Wt 176 lb 9.6 oz (80.1 kg)   SpO2 97%   BMI 26.85 kg/m     Wt Readings from Last 3 Encounters:  03/17/23 176 lb 9.6 oz (80.1 kg)  06/17/22 179 lb 3.2 oz (81.3 kg)  03/25/22 183 lb 3.2 oz (83.1 kg)     GEN:  Well nourished, well developed in no acute distress HEENT: Normal NECK: No JVD; No carotid bruits LYMPHATICS: No lymphadenopathy CARDIAC: irregularly irregular, no murmurs, rubs, gallops RESPIRATORY:  Clear to auscultation without rales, wheezing or rhonchi  ABDOMEN: Soft, non-tender, non-distended MUSCULOSKELETAL:  No edema; No deformity  SKIN: Warm and dry NEUROLOGIC:  Alert and oriented x 3 PSYCHIATRIC:  Normal affect   ASSESSMENT:    1. Coronary artery disease involving native coronary artery of native heart without angina pectoris   2. Persistent atrial fibrillation (HCC)   3. Mixed hyperlipidemia   4.  Essential hypertension    PLAN:    In order of problems listed above:  The patient continues to do well with no angina.  He is treated with carvedilol and Repatha.  He is not on antiplatelet therapy because of chronic oral anticoagulation. Well-tolerated.  Last year's echo reviewed with LVEF 55 to 60%.  No significant valvular disease.  Continue apixaban for anticoagulation.  I reviewed his labs today demonstrating normal hemoglobin of 14.3. Treated with Repatha and Zetia.  Cholesterol is 124, triglycerides 100, HDL 49, LDL 56. Continue carvedilol, lisinopril, and hydrochlorothiazide.           Medication Adjustments/Labs and Tests Ordered: Current medicines are reviewed at length with the patient today.  Concerns regarding medicines are outlined above.  Orders Placed This Encounter  Procedures   EKG 12-Lead   Meds ordered this encounter  Medications   ezetimibe (ZETIA) 10 MG tablet    Sig: Take 1 tablet (10 mg total) by mouth daily.    Dispense:  90 tablet    Refill:  3    Patient Instructions  Medication Instructions:  REFILLED Ezetimibe/Zetia *If you need a refill on your cardiac medications before your next appointment, please call your pharmacy*   Lab Work: NONE If you have labs (blood work) drawn today and your tests are completely normal, you will receive your results only by: MyChart Message (if you have MyChart) OR A paper copy in the mail If you have any lab test that is abnormal or we need to change your treatment, we will call you to review the results.   Testing/Procedures: NONE   Follow-Up: At [facility], you and your health needs are our priority.  As part of our continuing mission to provide you with exceptional heart care, we have created designated Provider Care Teams.  These Care Teams include your primary Cardiologist (physician) and Advanced Practice Providers (APPs -  Physician Assistants and Nurse Practitioners) who all work together  to provide you with the care you need, when you need it.  We recommend signing up for the patient portal called "MyChart".  Sign up information is provided on this After Visit Summary.  MyChart is used to connect with patients for Virtual Visits (Telemedicine).  Patients are able to view lab/test results, encounter notes, upcoming appointments, etc.  Non-urgent messages can be sent to your provider as well.   To learn more about what you can do with MyChart, go to [email or web address].    Your next appointment:   1 year(s)  Provider:   Tonny Bollman, MD        Signed, Tonny Bollman, MD  03/17/2023 9:15 AM    [facility]

## 2023-03-17 NOTE — Patient Instructions (Signed)
Medication Instructions:  REFILLED Ezetimibe/Zetia *If you need a refill on your cardiac medications before your next appointment, please call your pharmacy*   Lab Work: NONE If you have labs (blood work) drawn today and your tests are completely normal, you will receive your results only by: MyChart Message (if you have MyChart) OR A paper copy in the mail If you have any lab test that is abnormal or we need to change your treatment, we will call you to review the results.   Testing/Procedures: NONE   Follow-Up: At [facility], you and your health needs are our priority.  As part of our continuing mission to provide you with exceptional heart care, we have created designated Provider Care Teams.  These Care Teams include your primary Cardiologist (physician) and Advanced Practice Providers (APPs -  Physician Assistants and Nurse Practitioners) who all work together to provide you with the care you need, when you need it.  We recommend signing up for the patient portal called "MyChart".  Sign up information is provided on this After Visit Summary.  MyChart is used to connect with patients for Virtual Visits (Telemedicine).  Patients are able to view lab/test results, encounter notes, upcoming appointments, etc.  Non-urgent messages can be sent to your provider as well.   To learn more about what you can do with MyChart, go to [email or web address].    Your next appointment:   1 year(s)  Provider:   Tonny Bollman, MD

## 2023-04-23 ENCOUNTER — Other Ambulatory Visit: Payer: Self-pay | Admitting: Cardiovascular Disease

## 2023-04-23 NOTE — Telephone Encounter (Signed)
Pt last saw Dr Excell Seltzer 03/17/23, last labs 03/03/23 Creat 1.09, age 77, weight 80.1kg, baseed on specified criteria pt is on appropriate dosage of Eliquis 5mg  BID for afib.  Will refill rx.

## 2023-05-22 ENCOUNTER — Other Ambulatory Visit: Payer: Self-pay | Admitting: Internal Medicine

## 2023-05-27 ENCOUNTER — Other Ambulatory Visit: Payer: Self-pay | Admitting: Cardiovascular Disease

## 2023-05-27 ENCOUNTER — Telehealth: Payer: Self-pay | Admitting: Internal Medicine

## 2023-05-27 DIAGNOSIS — E785 Hyperlipidemia, unspecified: Secondary | ICD-10-CM

## 2023-05-27 DIAGNOSIS — I10 Essential (primary) hypertension: Secondary | ICD-10-CM

## 2023-05-27 NOTE — Telephone Encounter (Signed)
Diane wife states patient needs refill for Anoro inhaler. [facility]. Diane phone number is [phone].

## 2023-05-27 NOTE — Telephone Encounter (Signed)
Pt's medication was sent to pt's pharmacy as requested. Confirmation received.  °

## 2023-05-30 ENCOUNTER — Other Ambulatory Visit: Payer: Self-pay

## 2023-05-30 MED ORDER — ANORO ELLIPTA 62.5-25 MCG/ACT IN AEPB: 1.0000 | INHALATION_SPRAY | Freq: Every day | RESPIRATORY_TRACT | 3 refills | Status: DC

## 2023-05-30 NOTE — Telephone Encounter (Signed)
Spoke with patients wife. Advised refills for Anoro has been sent to pharmacy. NFN

## 2023-06-09 ENCOUNTER — Ambulatory Visit: Payer: PPO | Admitting: Internal Medicine

## 2023-06-09 ENCOUNTER — Encounter: Payer: Self-pay | Admitting: Internal Medicine

## 2023-06-09 VITALS — BP 136/76 | HR 64 | Temp 98.5°F | Ht 68.0 in | Wt 175.8 lb

## 2023-06-09 DIAGNOSIS — Z85118 Personal history of other malignant neoplasm of bronchus and lung: Secondary | ICD-10-CM

## 2023-06-09 DIAGNOSIS — I480 Paroxysmal atrial fibrillation: Secondary | ICD-10-CM | POA: Diagnosis not present

## 2023-06-09 DIAGNOSIS — J439 Emphysema, unspecified: Secondary | ICD-10-CM

## 2023-06-09 DIAGNOSIS — J4489 Other specified chronic obstructive pulmonary disease: Secondary | ICD-10-CM | POA: Diagnosis not present

## 2023-06-09 MED ORDER — ANORO ELLIPTA 62.5-25 MCG/ACT IN AEPB: 1.0000 | INHALATION_SPRAY | Freq: Every day | RESPIRATORY_TRACT | 11 refills | Status: DC

## 2023-06-09 NOTE — Progress Notes (Signed)
DIANNA BREHM    [id number]    [birth year]  Primary Care Physician:Wilson, Gloriajean Dell, MD Date of Appointment: 06/09/2023 Established Patient Visit  Chief complaint:   Chief Complaint  Patient presents with   Follow-up    Yearly follow up.  Doing well.     HPI: Antonio Lawrence is a 77 y.o. man with COPD and lung cancer 2001 s/p lobectomy, now in remission.   Interval Updates: Annual follow up No interval hospitalizations or ED visits.  Anoro is working well.   Albuterol use is very seldom.  Still keeping up with daily activities around the house and mowing the lawn.   Lives at home with wife, and has rescue greyhounds.   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   No bleeding adverse effects while on eliquis.   I have reviewed the patient's family social and past medical history and updated as appropriate.   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, PCI of the left circumflex/obtuse marginal, moderate nonobstructive disease of the LAD   Hyperglycemia    Hyperlipidemia    Hypertension    Lung cancer (HCC)    Myocardial infarction [facility])    Inferoposterior STEMI October 2015, PCI of the first OM branch with a drug-eluting stent    Past Surgical History:  Procedure Laterality Date   CARDIOVERSION N/A 01/09/2018   Procedure: CARDIOVERSION;  Surgeon: Lewayne Bunting, MD;  Location: [facility] ENDOSCOPY;  Service: Cardiovascular;  Laterality: N/A;   COLONOSCOPY WITH PROPOFOL N/A 01/13/2015   Procedure: COLONOSCOPY WITH PROPOFOL;  Surgeon: Theda Belfast, MD;  Location: [facility] ENDOSCOPY;  Service: Endoscopy;  Laterality: N/A;   ENTEROSCOPY N/A 01/13/2015   Procedure: ENTEROSCOPY with propofol;  Surgeon: Theda Belfast, MD;  Location: [facility] ENDOSCOPY;  Service: Endoscopy;  Laterality: N/A;   LEFT HEART CATH Bilateral 09/03/2014   Procedure: LEFT HEART CATH;   Surgeon: Pamella Pert, MD;  Location: [facility] CATH LAB;  Service: Cardiovascular;  Laterality: Bilateral;   LOBECTOMY      Family History  Problem Relation Age of Onset   Heart attack Mother    Cancer Father     Social History   Occupational History   Not on file  Tobacco Use   Smoking status: Former    Current packs/day: 0.00    Average packs/day: 1 pack/day for 25.0 years (25.0 ttl pk-yrs)    Types: Cigarettes    Start date: 12/25/1974    Quit date: 12/26/1999    Years since quitting: 23.4   Smokeless tobacco: Never  Vaping Use   Vaping status: Never Used  Substance and Sexual Activity   Alcohol use: No   Drug use: No   Sexual activity: Yes     Physical Exam: Blood pressure 136/76, pulse 64, temperature 98.5 F (36.9 C), temperature source Oral, height 5\' 8"  (1.727 m), weight 175 lb 12.8 oz (79.7 kg), SpO2 97%.  Gen:      No acute distress Lungs:    ctab no wheezes or crackles CV:        irregularly irregular   Data Reviewed: Imaging: I have personally reviewed the chest xray October 2022 - stable COPD and prior RLL   PFTs:     Latest Ref Rng & Units 12/17/2021   12:49 PM  PFT Results  FVC-Pre  L 3.23   FVC-Predicted Pre % 86   FVC-Post L 3.33   FVC-Predicted Post % 89   Pre FEV1/FVC % % 58   Post FEV1/FCV % % 60   FEV1-Pre L 1.86   FEV1-Predicted Pre % 68   FEV1-Post L 2.00   DLCO uncorrected ml/min/mmHg 14.28   DLCO UNC% % 62   DLCO corrected ml/min/mmHg 14.28   DLCO COR %Predicted % 62   DLVA Predicted % 70   TLC L 5.37   TLC % Predicted % 83   RV % Predicted % 91    I have personally reviewed the patient's PFTs and moderate COPD FEV2 68% of predicted with borderline response to bronchodilator. Normal lung volumes moderately reduced diffusion capacity   Labs:  Immunization status: Immunization History  Administered Date(s) Administered   Covid-19, Mrna,Vaccine(Spikevax)35yrs and older 08/19/2022   Influenza, High Dose Seasonal PF  09/28/2018, 10/04/2019, 10/11/2020   Influenza,inj,Quad PF,6+ Mos 09/04/2014   Influenza-Unspecified 09/04/2014, 10/24/2017   Moderna Covid-19 Vaccine Bivalent Booster 15yrs & up 09/29/2021   Moderna Sars-Covid-2 Vaccination 12/13/2019, 01/10/2020, 09/23/2020, 03/31/2021, 09/29/2021   Pneumococcal Conjugate-13 08/14/2020   Pneumococcal Polysaccharide-23 08/13/2019   Zoster Recombinant(Shingrix) 10/29/2021, 02/19/2022    External Records Personally Reviewed:   Assessment:  COPD, Gold stage 1 ( FEV68% of predicted), stable History of tobacco use disorder, in remission History of lung cancer s/p right lobectomy 2001, in remission Atrial fibrillation on eliquis, rate controlled  Plan/Recommendations:  continue anoro continue prn albuterol.  He is out of the window for lung cancer screening for tobacco use disorder   Return to Care: Return in about 1 year (around 06/08/2024).   Durel Salts, MD Pulmonary and Critical Care Medicine [facility] Office:[phone]

## 2023-06-09 NOTE — Patient Instructions (Signed)
Please schedule follow up scheduled with myself in 1 year.  If my schedule is not open yet, we will contact you with a reminder closer to that time. Please call [phone] if you haven't heard from [place] a month before.   I have refilled your anoro for a year. Call us if you need more albuterol sooner.   Call us sooner if issues or concerns.

## 2023-07-24 ENCOUNTER — Other Ambulatory Visit: Payer: Self-pay | Admitting: Cardiovascular Disease

## 2023-08-23 IMAGING — DX DG CHEST 2V
2 series · 2 of 2 positions shown · non-contrast
Comparison: PA and lateral chest 03/15/2016 and chest CT with IV
contrast 05/01/2004.

CLINICAL DATA: Coughing and wheezing.

EXAM:
CHEST - 2 VIEW

[dg chest 2 view (1 of 2)]
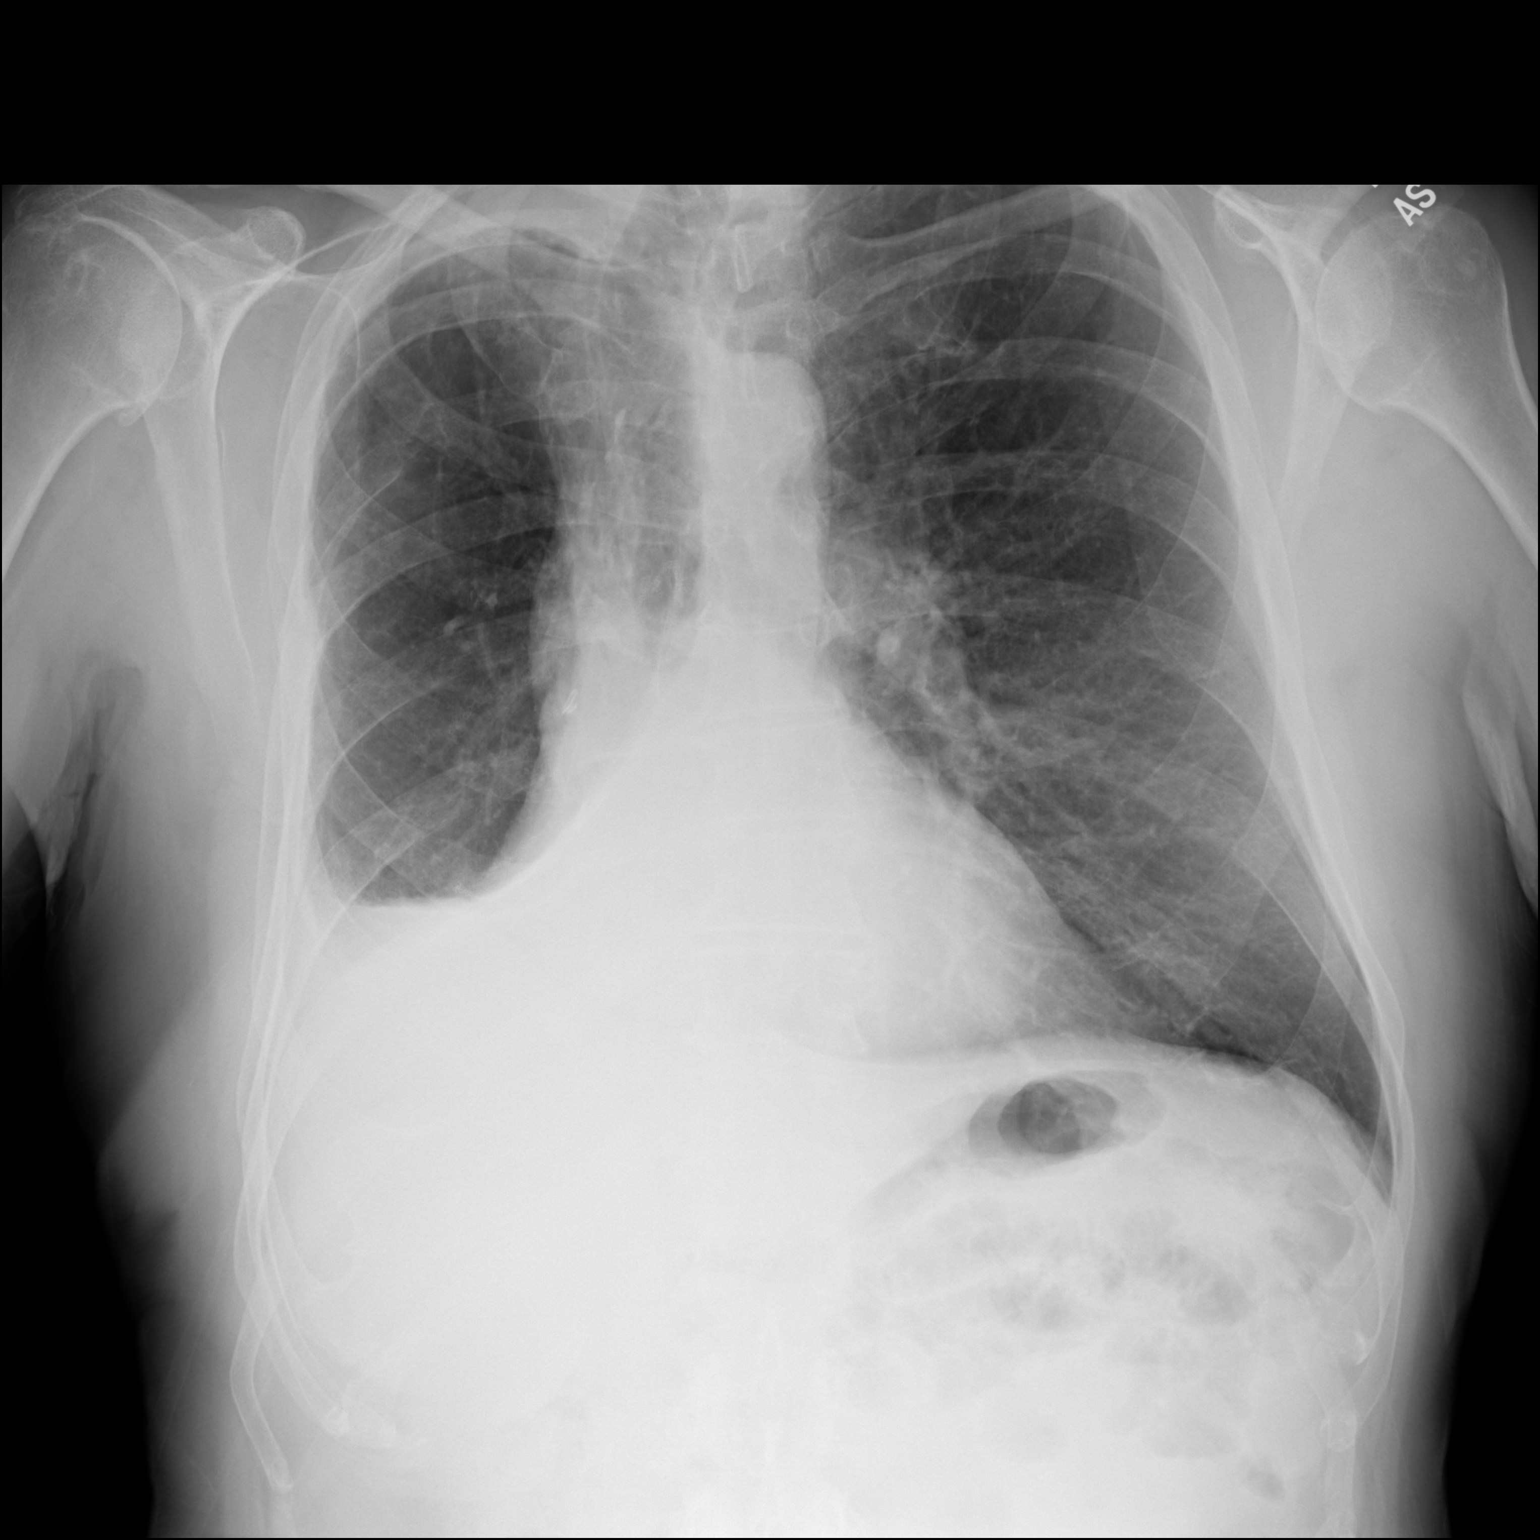

[dg chest 2 view (2 of 2)]
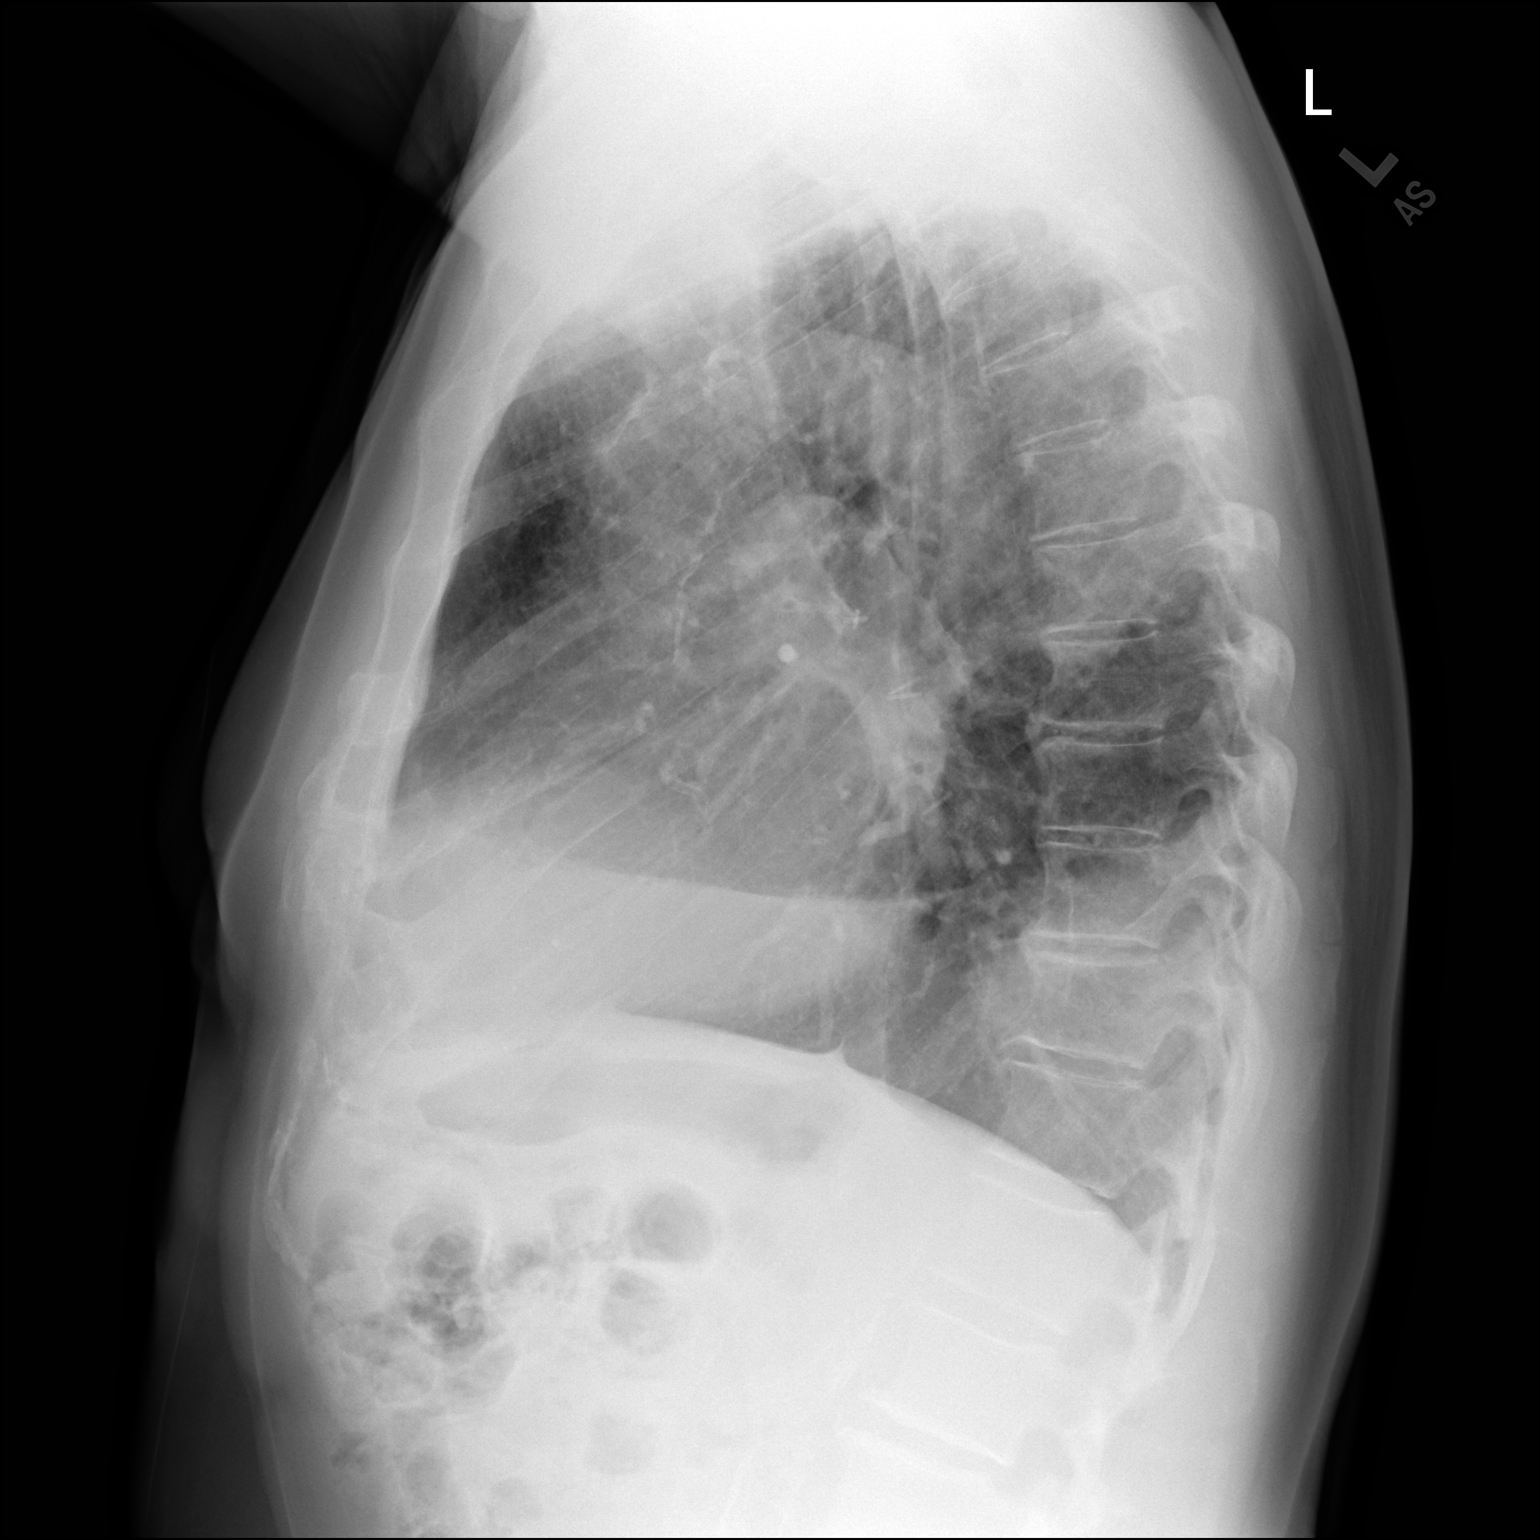

[2 of 2 positions shown; findings below may reference images not displayed]

FINDINGS: The lungs demonstrate emphysematous and chronic changes including
right-sided volume loss from prior lower lobectomy and chronic right
apical capping change and sulcal blunting. The aorta is mildly
tortuous with patchy calcifications and the cardiac size is normal.

No focal pneumonia is seen, or appreciable new abnormality. There is
mild thoracic spondylosis.
IMPRESSION: No evidence of acute chest disease. Stable COPD chest with prior
right lower lobectomy.

## 2023-09-11 ENCOUNTER — Telehealth: Payer: Self-pay | Admitting: Internal Medicine

## 2023-09-11 MED ORDER — PREDNISONE 20 MG PO TABS: 40.0000 mg | ORAL_TABLET | Freq: Every day | ORAL | 0 refills | Status: DC

## 2023-09-11 NOTE — Telephone Encounter (Signed)
Patient states having symptoms of shortness of breath and wheezing. No available appointments at this time. Pharmacy is [facility] [place]. Patient phone number is

## 2023-09-11 NOTE — Telephone Encounter (Signed)
Called and spoke with patient.  Patient stated he started having increaased sob and wheezing 2 days ago.  Patient stated he is using his albuterol several times a day.  Patient stated albuterol helps some.  Patient stated he no longer takes Anoro daily, because it made his cough, sob worse.  Patient request prescription to be sent to [facility]. Patient can be reached at his work number until [employer].  Message routed to Dr. Celine Mans

## 2023-09-11 NOTE — Telephone Encounter (Signed)
Prednisone sent to pharmacy for copd exacerbation. Continue albuterol 4 times daily as needed until symptoms improve. I think he needs to get an appointment with me to discuss a different inhaler then

## 2023-09-12 ENCOUNTER — Telehealth: Payer: Self-pay | Admitting: Internal Medicine

## 2023-09-12 NOTE — Telephone Encounter (Signed)
Spoke to patient's spouse, Diane(DPR).  She stated that patient has been on Anoro for years, however for the past 5 days, he has been experiencing throat burning.she is concerned that this is related to inhaler. Pt does rinse his mouth after using Anoro.  Dr. Celine Mans, please advise. Thanks

## 2023-09-12 NOTE — Telephone Encounter (Signed)
Lm x1 for patient's spouse, Diane(DPR).

## 2023-09-12 NOTE — Telephone Encounter (Signed)
ATC x1 LVM for patient to call our office back regarding prior message.  

## 2023-09-12 NOTE — Telephone Encounter (Signed)
Do they want to do a virtual visit with me at 3:15? Ok to book. Otherwise yes I agree could be related to inhaler and they should discontinue and get on mine or an APPs schedule.

## 2023-09-12 NOTE — Telephone Encounter (Signed)
Returning missed call

## 2023-09-12 NOTE — Telephone Encounter (Signed)
Pt wife calling about meds

## 2023-09-12 NOTE — Telephone Encounter (Signed)
Gerdts,Diane (Emergency Contact)  to [facility]  [facility]    09/12/23  2:37 PM She refused virutal visit. I advised that both the doctor and the apps are booked until January, but she did not want to do a virtual visit whatsoever as "we dont do that." Please advise.

## 2023-09-15 NOTE — Telephone Encounter (Signed)
Called and spoke with pt and his wife. Pt stated he is doing better using his anoro inhaler. Declines appt for 11/6 but did want a f/u in January that has been scheduled. Nfn

## 2023-09-17 ENCOUNTER — Ambulatory Visit: Payer: PPO | Admitting: Internal Medicine

## 2023-09-24 NOTE — Telephone Encounter (Signed)
Called patient.  Patient states he has no further questions regarding his medications.  He will call the clinic if anything else is needed.  Will close encounter.

## 2023-10-21 ENCOUNTER — Telehealth: Payer: Self-pay | Admitting: Cardiovascular Disease

## 2023-10-21 NOTE — Telephone Encounter (Signed)
Ed with [facility] is calling stating the patient was found deceased last Oct 24, 2023. He is wanting to know if Dr. Excell Seltzer is willing to sign the death certificate.    Please advise.

## 2023-10-22 NOTE — Telephone Encounter (Signed)
Antonio Lawrence is calling back to check the status of death certificate

## 2023-10-23 ENCOUNTER — Telehealth: Payer: Self-pay | Admitting: Cardiovascular Disease

## 2023-10-23 NOTE — Telephone Encounter (Signed)
I just spoke with his wife and was told that he died suddenly on November 10, 2023 at approximately 2pm of unknown cause. I am willing to fill out the death certificate. I tried to locate him in the [facility] system and was unable to find. I think they typically 'assign' the case to a physician and then I fill out the details. Thanks

## 2023-10-23 NOTE — Telephone Encounter (Signed)
[facility] calling back to see if Dr. Excell Seltzer will sign death certificate. She will also contact PCP

## 2023-10-23 NOTE — Telephone Encounter (Signed)
New Message:     Wife called and wanted Dr Excell Seltzer to know that patient had a heart attack on Saturday(10/26/2023) and passed away suddenly.

## 2023-10-24 NOTE — Telephone Encounter (Signed)
Returned call to funeral home who states that were able to get another doctor to sign the death certificate. No further needs.

## 2023-11-12 DEATH — deceased

## 2023-11-26 ENCOUNTER — Telehealth: Payer: Self-pay | Admitting: Internal Medicine

## 2023-11-26 NOTE — Telephone Encounter (Signed)
 Diane calling about patient's inhaler. Would not leave a message. Diane phone number is [phone].

## 2023-11-27 NOTE — Telephone Encounter (Signed)
Antonio Lawrence is checking on message for inhaler. Antonio Lawrence phone number is [phone].

## 2023-11-27 NOTE — Telephone Encounter (Signed)
Pt wife states the pt passed away Nov 12, 2024. Pt wife states Cardiologist believe it was from a massive heart attack, but he was found with his albuterol in his hand. Pt did not continue this conversation with me, and just wanted to make sure Dr Celine Mans was aware he had passed. NFN

## 2023-11-28 NOTE — Telephone Encounter (Signed)
I called dianne and spoke with her at length and offered my condolences on Tommy's passing away.

## 2023-12-01 ENCOUNTER — Ambulatory Visit: Payer: PPO | Admitting: Internal Medicine
